# Patient Record
Sex: Female | Born: 1955 | Race: Black or African American | Hispanic: No | Marital: Married | Smoking: Former smoker
Health system: Southern US, Community
[De-identification: ages and names within clinical notes are randomized; demographics above are authoritative.]

## PROBLEM LIST (undated history)

## (undated) HISTORY — PX: ABDOMINAL HYSTERECTOMY: SHX81

## (undated) HISTORY — PX: CHOLECYSTECTOMY: SHX55

---

## 2002-03-01 ENCOUNTER — Encounter: Payer: Self-pay | Admitting: Family Medicine

## 2002-03-01 ENCOUNTER — Encounter: Admission: RE | Admit: 2002-03-01 | Discharge: 2002-03-01 | Payer: Self-pay | Admitting: Family Medicine

## 2005-06-15 ENCOUNTER — Other Ambulatory Visit: Admission: RE | Admit: 2005-06-15 | Discharge: 2005-06-15 | Payer: Self-pay | Admitting: Gynecology

## 2006-07-19 ENCOUNTER — Other Ambulatory Visit: Admission: RE | Admit: 2006-07-19 | Discharge: 2006-07-19 | Payer: Self-pay | Admitting: Gynecology

## 2006-09-21 ENCOUNTER — Inpatient Hospital Stay (HOSPITAL_COMMUNITY): Admission: RE | Admit: 2006-09-21 | Discharge: 2006-09-22 | Payer: Self-pay | Admitting: Gynecology

## 2010-08-09 ENCOUNTER — Encounter: Payer: Self-pay | Admitting: Obstetrics & Gynecology

## 2010-10-30 ENCOUNTER — Other Ambulatory Visit (HOSPITAL_COMMUNITY)
Admission: RE | Admit: 2010-10-30 | Discharge: 2010-10-30 | Disposition: A | Discharge status: Home / Self Care | Payer: BC Managed Care – PPO | Source: Ambulatory Visit | Attending: Gynecology | Admitting: Gynecology

## 2010-10-30 ENCOUNTER — Other Ambulatory Visit: Payer: Self-pay | Admitting: Women's Health

## 2010-10-30 ENCOUNTER — Ambulatory Visit (INDEPENDENT_AMBULATORY_CARE_PROVIDER_SITE_OTHER): Payer: BC Managed Care – PPO | Admitting: Women's Health

## 2010-10-30 DIAGNOSIS — N951 Menopausal and female climacteric states: Secondary | ICD-10-CM

## 2010-10-30 DIAGNOSIS — Z13220 Encounter for screening for lipoid disorders: Secondary | ICD-10-CM

## 2010-10-30 DIAGNOSIS — Z01419 Encounter for gynecological examination (general) (routine) without abnormal findings: Secondary | ICD-10-CM

## 2010-10-30 DIAGNOSIS — Z124 Encounter for screening for malignant neoplasm of cervix: Secondary | ICD-10-CM | POA: Insufficient documentation

## 2010-10-30 DIAGNOSIS — Z833 Family history of diabetes mellitus: Secondary | ICD-10-CM

## 2010-10-31 ENCOUNTER — Other Ambulatory Visit: Payer: Self-pay | Admitting: Gynecology

## 2010-10-31 DIAGNOSIS — Z1231 Encounter for screening mammogram for malignant neoplasm of breast: Secondary | ICD-10-CM

## 2010-11-14 ENCOUNTER — Ambulatory Visit (HOSPITAL_COMMUNITY)
Admission: RE | Admit: 2010-11-14 | Discharge: 2010-11-14 | Disposition: A | Discharge status: Home / Self Care | Payer: BC Managed Care – PPO | Source: Ambulatory Visit | Attending: Gynecology | Admitting: Gynecology

## 2010-11-14 DIAGNOSIS — Z1231 Encounter for screening mammogram for malignant neoplasm of breast: Secondary | ICD-10-CM | POA: Insufficient documentation

## 2011-01-10 ENCOUNTER — Inpatient Hospital Stay (HOSPITAL_COMMUNITY)
Admission: EM | Admit: 2011-01-10 | Discharge: 2011-01-14 | DRG: 419 | Disposition: A | Discharge status: Home / Self Care | Payer: BC Managed Care – PPO | Attending: General Surgery | Admitting: General Surgery

## 2011-01-10 ENCOUNTER — Emergency Department (HOSPITAL_COMMUNITY): Payer: BC Managed Care – PPO

## 2011-01-10 DIAGNOSIS — F17200 Nicotine dependence, unspecified, uncomplicated: Secondary | ICD-10-CM | POA: Diagnosis present

## 2011-01-10 DIAGNOSIS — K219 Gastro-esophageal reflux disease without esophagitis: Secondary | ICD-10-CM | POA: Diagnosis present

## 2011-01-10 DIAGNOSIS — K810 Acute cholecystitis: Principal | ICD-10-CM | POA: Diagnosis present

## 2011-01-10 LAB — CK TOTAL AND CKMB (NOT AT ARMC): Relative Index: 1.5 (ref 0.0–2.5)

## 2011-01-10 LAB — DIFFERENTIAL
Basophils Absolute: 0 10*3/uL (ref 0.0–0.1)
Basophils Relative: 0 % (ref 0–1)
Eosinophils Absolute: 0 10*3/uL (ref 0.0–0.7)
Eosinophils Relative: 0 % (ref 0–5)
Lymphocytes Relative: 17 % (ref 12–46)
Lymphs Abs: 1.4 10*3/uL (ref 0.7–4.0)
Monocytes Absolute: 0.4 10*3/uL (ref 0.1–1.0)
Monocytes Relative: 5 % (ref 3–12)
Neutro Abs: 6.4 10*3/uL (ref 1.7–7.7)
Neutrophils Relative %: 79 % — ABNORMAL HIGH (ref 43–77)

## 2011-01-10 LAB — CBC
HCT: 36.6 % (ref 36.0–46.0)
MCV: 77.2 fL — ABNORMAL LOW (ref 78.0–100.0)
Platelets: 412 10*3/uL — ABNORMAL HIGH (ref 150–400)
RBC: 4.74 MIL/uL (ref 3.87–5.11)
RDW: 15.6 % — ABNORMAL HIGH (ref 11.5–15.5)
WBC: 8.2 10*3/uL (ref 4.0–10.5)

## 2011-01-10 LAB — COMPREHENSIVE METABOLIC PANEL
AST: 14 U/L (ref 0–37)
Albumin: 3.8 g/dL (ref 3.5–5.2)
Alkaline Phosphatase: 79 U/L (ref 39–117)
CO2: 24 mEq/L (ref 19–32)
Chloride: 102 mEq/L (ref 96–112)
Creatinine, Ser: <0.47 mg/dL — ABNORMAL LOW (ref 0.50–1.10)
Potassium: 3.8 mEq/L (ref 3.5–5.1)
Total Bilirubin: 0.4 mg/dL (ref 0.3–1.2)

## 2011-01-10 LAB — HEPATIC FUNCTION PANEL
ALT: 17 U/L (ref 0–35)
AST: 17 U/L (ref 0–37)
Albumin: 3.8 g/dL (ref 3.5–5.2)
Bilirubin, Direct: 0.1 mg/dL (ref 0.0–0.3)
Total Protein: 7.5 g/dL (ref 6.0–8.3)

## 2011-01-10 LAB — URINALYSIS, ROUTINE W REFLEX MICROSCOPIC
Bilirubin Urine: NEGATIVE
Glucose, UA: NEGATIVE mg/dL
Hgb urine dipstick: NEGATIVE
Ketones, ur: NEGATIVE mg/dL
Protein, ur: NEGATIVE mg/dL
pH: 5 (ref 5.0–8.0)

## 2011-01-10 LAB — TROPONIN I: Troponin I: <0.3 ng/mL (ref <0.30)

## 2011-01-11 ENCOUNTER — Inpatient Hospital Stay (HOSPITAL_COMMUNITY): Payer: BC Managed Care – PPO

## 2011-01-11 NOTE — H&P (Signed)
  NAMEJAYDALYN, [name]                 ACCOUNT NO.:  [id number]  MEDICAL RECORD NO.:  [id number]  LOCATION:  5154                         FACILITY:  [facility]  PHYSICIAN:  Wilmon Arms. Corliss Skains, M.D. DATE OF BIRTH:  16-Sep-1955  DATE OF ADMISSION:  01/10/2011 DATE OF DISCHARGE:                             HISTORY & PHYSICAL   CHIEF COMPLAINT:  Right upper quadrant abdominal pain, nausea, and vomiting.  HISTORY OF PRESENT ILLNESS:  This is a 55 year old female who presents with a 24-hour history of right upper quadrant epigastric pain.  This began after eating a meal of spicy chicken last night.  The pain has persisted and actually worsened through the day.  She has had nausea and vomiting with greenish-appearing emesis.  She denies any hematemesis. The pain had been radiating up into her chest and around her right side, but has improved somewhat with IV pain medication.  She has had some diarrhea.  She also reports that her abdomen is fairly bloated.  She reports a lot of problems with "indigestion" usually relieved by Tums. She has also noted a lot of bloating and gas over the last few months. She was seen in urgent care facility who referred her to the emergency department for evaluation.  PAST MEDICAL HISTORY:  None.  PAST SURGICAL HISTORY:  Two C-sections and a hysterectomy.  FAMILY HISTORY:  Her father has diabetes and hypertension.  SOCIAL HISTORY:  Nonsmoker, nondrinker.  REVIEW OF SYSTEMS:  Otherwise negative.  MEDICATIONS:  None.  ALLERGIES:  None.  PHYSICAL EXAMINATION:  VITAL SIGNS:  Temperature 97.8, heart rate 74, blood pressure 108/68, respirations 18, sats 99%. GENERAL:  This is a well-developed, well-nourished female who is rather uncomfortable appearing. HEENT:  EOMI.  Sclerae anicteric. NECK:  No masses.  No thyromegaly. LUNGS:  Clear.  Normal respiratory effort. HEART:  Regular rhythm.  No murmur. ABDOMEN:  Positive bowel sounds, mildly distended, tender in the  right upper quadrant and epigastrium.  No palpable masses.  No rebound or guarding. EXTREMITIES:  No edema. SKIN:  Warm, dry with no sign of jaundice.  LABORATORY DATA:  White count 8.2, hemoglobin 12.9, platelet count 412. Electrolytes within normal limits.  Liver function test within normal limits.  Lipase 23.  Urinalysis negative.  Ultrasound shows a 2-cm gallstone lodged in the neck of her gallbladder, but no pericholecystic fluid or wall thickening.  She does have a positive sonographic Murphy's sign.  The common bile duct is measuring 9 mm, which is mildly dilated. Acute abdominal series is negative.  IMPRESSION:  Symptomatic cholelithiasis with a possible early acute cholecystitis.  PLAN:  We will admit the patient for IV antibiotics, pain control, nausea control, and IV hydration.  We will plan for probable laparoscopic cholecystectomy in the morning.     Wilmon Arms. Corliss Skains, M.D.     MKT/MEDQ  D:  01/10/2011  T:  01/11/2011  Job:  [id number]  Electronically Signed by Manus Rudd M.D. on 01/11/2011 08:33:53 PM

## 2011-01-12 ENCOUNTER — Other Ambulatory Visit (INDEPENDENT_AMBULATORY_CARE_PROVIDER_SITE_OTHER): Payer: Self-pay | Admitting: General Surgery

## 2011-01-12 ENCOUNTER — Inpatient Hospital Stay (HOSPITAL_COMMUNITY): Payer: BC Managed Care – PPO

## 2011-01-12 LAB — SURGICAL PCR SCREEN
MRSA, PCR: NEGATIVE
Staphylococcus aureus: NEGATIVE

## 2011-01-14 LAB — COMPREHENSIVE METABOLIC PANEL
Albumin: 2.9 g/dL — ABNORMAL LOW (ref 3.5–5.2)
Alkaline Phosphatase: 111 U/L (ref 39–117)
BUN: 5 mg/dL — ABNORMAL LOW (ref 6–23)
CO2: 30 mEq/L (ref 19–32)
Chloride: 105 mEq/L (ref 96–112)
Potassium: 3.2 mEq/L — ABNORMAL LOW (ref 3.5–5.1)
Total Bilirubin: 1 mg/dL (ref 0.3–1.2)

## 2011-01-14 LAB — CBC
HCT: 30 % — ABNORMAL LOW (ref 36.0–46.0)
Hemoglobin: 10.4 g/dL — ABNORMAL LOW (ref 12.0–15.0)
MCV: 78.3 fL (ref 78.0–100.0)
RBC: 3.83 MIL/uL — ABNORMAL LOW (ref 3.87–5.11)
RDW: 15.1 % (ref 11.5–15.5)
WBC: 9.3 10*3/uL (ref 4.0–10.5)

## 2011-01-29 NOTE — Op Note (Signed)
[name], [name] NO.:  [id number]  MEDICAL RECORD NO.:  [id number]  LOCATION:  5154                         FACILITY:  [facility]  PHYSICIAN:  Mary Sella. Andrey Campanile, MD     DATE OF BIRTH:  1955/08/26  DATE OF PROCEDURE:  01/12/2011 DATE OF DISCHARGE:                              OPERATIVE REPORT   PREOPERATIVE DIAGNOSIS:  Acute cholecystitis.  POSTOPERATIVE DIAGNOSIS:  Acute cholecystitis.  PROCEDURE:  Laparoscopic cholecystectomy with intraoperative cholangiogram.  SURGEON:  Mary Sella. Andrey Campanile, MD  ASSISTANT:  Brayton El, PA-C  ANESTHESIA:  General plus local consisting 0.25% with Marcaine.  SPECIMEN:  Gallbladder.  FINDINGS:  The patient had a very distended thick-walled edematous gallbladder.  We had to aspirate in order to help with retraction.  We did achieve the critical view.  The cholangiogram demonstrated prompt filling of the cystic common bile duct, common hepatic left and right hepatic ducts as well as prompt emptying into duodenum.  The bile duct was grossly dilated, but there was no evidence of filling defect.  INDICATIONS FOR PROCEDURE:  The patient is an obese 55 year old African American female who started having abdominal pain on Friday evening.  It occurred after eating some spicy foods.  The pain persisted.  She denied having any previous symptoms.  It was in her epigastrium and right radiated to her right side.  She came to the emergency room.  She was found to have a 2-cm gallstone in the neck of her gallbladder.  She was admitted for IV antibiotics as well as IV fluids and pain control.  She was taken today to the operating room for laparoscopic possible open cholecystectomy.  We discussed at length the risks and benefits of surgery including, but not limited to bleeding, infection, injury to surrounding structures, bile leak, prolonged diarrhea, DVT occurrence, need to convert to an open procedure, injury to the common bile  duct requiring major reconstructive bile duct surgery as well as abscess formation.  We also talked about the typical postoperative course.  She elected to proceed to surgery.  DESCRIPTION OF PROCEDURE:  After obtaining informed consent, the patient was taken to the operating room and placed supine on the operating table.  General endotracheal anesthesia was established.  Sequential compression devices were placed.  She received antibiotics earlier this morning.  A surgical time-out was performed.  The abdomen was prepped and draped in the usual standard surgical fashion.  Local was infiltrated at the base of her umbilicus.  Next, a 1-inch vertical infraumbilical incision was made with a #11 blade.  The fascia was grasped with Kochers and lifted anteriorly.  Next, the fascia was incised and the abdominal cavity was entered.  A pursestring suture consisting of 0 Vicryl on a UR6 needle was placed around the fascial edges.  The Hasson trocar was placed into the abdominal cavity under direct visualization and pneumoperitoneum was smoothly established up to a patient pressure of 15 mmHg.  The laparoscope was advanced.  She was then placed in reverse Trendelenburg and rotated to the left.  I placed a 5-mm trocar in the subxiphoid position and two 5-mm trocars in the right hypochondrium all under direct  visualization after local had been identified.  She had a lot of omentum plastered to the body of her gallbladder.  This was peeled off.  The gallbladder was very distended and thick-walled actually.  We had to aspirate it and decompress in order to help with mobilization.  Once this was done, the gallbladder was retracted to the right shoulder and the neck was retracted laterally.  I identified the common bile duct.  It was tinted up.  I then incised the peritoneum both medially and laterally with hook electrocautery to aid with mobilization.  Then using the suction catheter, I performed a  blunt dissection along with  water dissection.  I was able to tease up the gallbladder away from the common bile duct.  I began to visualize the cystic duct as well as the cystic artery.  I was able to lift the gallbladder up and away from the common bile duct.  Again, I was able to circumferentially dissect around the cystic duct and the cystic artery.  Once I was convinced these are the only two structures entering the gallbladder, I placed a clip across the distal cystic duct as I entered the gallbladder, then partially transected it with Endo shears, percutaneously placed a Cook cholangiogram catheter through the abdominal wall and secured it with a clip.  The cholangiogram was performed as described above. Pneumoperitoneum was reestablished.  The clip securing the cholangiogram catheter was removed and the cholangiogram catheter was removed.  Three clips were placed on the downside of the cystic duct.  I then transected it with Endo shears.  One clip was placed on the downside cystic artery and one distally.  It was transected with hook electrocautery.  We then mobilized the gallbladder up at the gallbladder fossa using hook electrocautery.  The gallbladder essentially freed.  The camera was placed in the subxiphoid trocar and the bag was placed through the umbilical trocar and the specimen was placed within the bag and extracted from the abdominal cavity.  Pneumoperitoneum was reestablished.  I then irrigated the right upper quadrant with essentially 2 L of saline and aspirated it.  I inspected the gallbladder fossa.  There was no evidence of bleeding or bile leak.  The clips were secured across the cystic duct and cystic artery remnant.  I removed the Hasson trocar and tied down the previously placed pursestring suture, thus obliterating fascial defect.  This was confirmed laparoscopically. There was no air leak at the umbilicus.  Pneumoperitoneum was released and the remaining  trocars removed.  All skin incisions were closed with a 4-0 Monocryl in subcuticular fashion followed by the application of Dermabond.  The patient was extubated and taken to recovery room in stable addition.  There were no immediate complications.  The patient tolerated the procedure well.  All needle, instrument, and sponge counts were correct x2.     Mary Sella. Andrey Campanile, MD     EMW/MEDQ  D:  01/12/2011  T:  01/13/2011  Job:  [id number]  Electronically Signed by Gaynelle Adu M.D. on 01/29/2011 02:25:14 PM

## 2011-02-03 ENCOUNTER — Encounter (INDEPENDENT_AMBULATORY_CARE_PROVIDER_SITE_OTHER): Payer: Self-pay

## 2011-02-04 NOTE — Discharge Summary (Signed)
  NAMEKODY, VIGIL NO.:  [id number]  MEDICAL RECORD NO.:  [id number]  LOCATION:  5154                         FACILITY:  [facility]  PHYSICIAN:  Mary Sella. Andrey Campanile, MD     DATE OF BIRTH:  1956/06/01  DATE OF ADMISSION:  01/10/2011 DATE OF DISCHARGE:  01/14/2011                              DISCHARGE SUMMARY   ADMISSION DIAGNOSIS:  Acute cholecystitis.  DISCHARGE DIAGNOSIS:  Acute cholecystitis.  PROCEDURES:  Laparoscopic cholecystectomy with intraoperative cholangiogram January 12, 2011.  BRIEF HISTORY:  The patient is a 55 year old female who presented with 24 hours of right upper quadrant pain after eating spicy chicken the night prior.  The pain persisted and became worse.  She was seen in the emergency room and workup included a white count of 8.2, hemoglobin 12.9, platelets 412,000.  Electrolytes were normal.  LFTs were normal. Ultrasound shows a 2-cm gallstone lodged in the neck of the gallbladder, there is no pericholecystic fluid or wall thickening.  She does have a positive sonographic Murphy sign.  The common bile duct was dilated measuring 9 mm which is mildly dilated.  Abdominal series was negative. HOSPITAL COURSE:  The patient was seen in consultation by Dr. Manus Rudd and subsequently admitted for further evaluation and treatment.  He started her on IV antibiotics and hydration and pain control and nausea control with plans for laparoscopic cholecystectomy the following morning.  The patient was seen and taken to the operating room the next morning, January 12, 2011, and underwent laparoscopic cholecystectomy with intraoperative cholangiogram.  She tolerated the procedure well.  First postoperative morning, she was doing well.  Her wounds looked good.  She had some mild nausea but no vomiting.  Her diet was ultimately advanced and she was discharged later on January 14, 2011, her second postoperative day.  She was sent home on her preadmission medicines  and Percocet one to two p.o. q.4 h. p.r.n.Marland Kitchen  She returned for followup February 03, 2011, at 2:30 p.m.  DISCHARGE ACTIVITY:  Light to moderate for the next week or two.  No strenuous activity.  She is to wash daily.  She can remove Steri-Strips in 1 week.  Call for any redness, fever, abdominal pain or new discomfort.  CONDITION ON DISCHARGE:  Improved.     Eber Hong, P.A.   ______________________________ Mary Sella. Andrey Campanile, MD   WDJ/MEDQ  D:  01/26/2011  T:  01/27/2011  Job:  [id number]  Electronically Signed by Sherrie George P.A. on 02/03/2011 04:17:45 PM Electronically Signed by Gaynelle Adu M.D. on 02/04/2011 11:26:08 PM

## 2011-02-06 ENCOUNTER — Telehealth (INDEPENDENT_AMBULATORY_CARE_PROVIDER_SITE_OTHER): Payer: Self-pay | Admitting: General Surgery

## 2011-02-10 NOTE — Telephone Encounter (Signed)
Patient has scheduled follow up appt

## 2011-11-23 ENCOUNTER — Encounter: Payer: Self-pay | Admitting: Women's Health

## 2017-09-13 ENCOUNTER — Other Ambulatory Visit (HOSPITAL_COMMUNITY): Payer: Self-pay | Admitting: *Deleted

## 2017-09-13 DIAGNOSIS — N644 Mastodynia: Secondary | ICD-10-CM

## 2017-10-14 ENCOUNTER — Ambulatory Visit
Admission: RE | Admit: 2017-10-14 | Discharge: 2017-10-14 | Disposition: A | Discharge status: Home / Self Care | Payer: No Typology Code available for payment source | Source: Ambulatory Visit | Attending: Obstetrics and Gynecology | Admitting: Obstetrics and Gynecology

## 2017-10-14 ENCOUNTER — Other Ambulatory Visit (HOSPITAL_COMMUNITY): Payer: Self-pay | Admitting: Obstetrics and Gynecology

## 2017-10-14 ENCOUNTER — Encounter (HOSPITAL_COMMUNITY): Payer: Self-pay

## 2017-10-14 ENCOUNTER — Ambulatory Visit (HOSPITAL_COMMUNITY)
Admission: RE | Admit: 2017-10-14 | Discharge: 2017-10-14 | Disposition: A | Discharge status: Home / Self Care | Payer: Self-pay | Source: Ambulatory Visit | Attending: Obstetrics and Gynecology | Admitting: Obstetrics and Gynecology

## 2017-10-14 ENCOUNTER — Other Ambulatory Visit: Payer: Self-pay | Admitting: Obstetrics and Gynecology

## 2017-10-14 VITALS — BP 138/88 | Ht 64.0 in

## 2017-10-14 DIAGNOSIS — Z01419 Encounter for gynecological examination (general) (routine) without abnormal findings: Secondary | ICD-10-CM

## 2017-10-14 DIAGNOSIS — N6310 Unspecified lump in the right breast, unspecified quadrant: Secondary | ICD-10-CM

## 2017-10-14 DIAGNOSIS — N898 Other specified noninflammatory disorders of vagina: Secondary | ICD-10-CM

## 2017-10-14 DIAGNOSIS — N644 Mastodynia: Secondary | ICD-10-CM

## 2017-10-14 NOTE — Patient Instructions (Signed)
Explained breast self awareness with Francoise Ceoobin Colleen Royse. Let patient know [facility] will cover Pap smears and HPV typing every 5 years unless has a history of abnormal Pap smears. [facility] and possible left breast ultrasound. Appointment scheduled for Thursday, October 14, 2017 at 1010. Let patient know will follow up with her within the next week with results of Pap smear and wet prep by phone. Francoise Ceoobin Colleen Schurman verbalized understanding.  Faryn Sieg, Kathaleen Maserhristine Poll, RN 12:18 PM

## 2017-10-14 NOTE — Progress Notes (Signed)
Complaints of bilateral outer breast tenderness that is greater within the left breast that comes and goes. Patient rates the pain at a 7.5 out of 10.  Pap Smear: Pap smear completed today. [facility]. Per patient has no history of an abnormal Pap smear. Patient has a history of a supracervical hysterectomy in 2005 due to AUB and fibroids. Last Pap smear result is in Epic.  Physical exam: Breasts Left breast is slightly larger than right breast. Per patient that is normal for her. No skin abnormalities bilateral breasts. No nipple retraction bilateral breasts. No nipple discharge bilateral breasts. No lymphadenopathy. No lumps palpated bilateral breasts. Complaints of left breast tenderness between 3-4 o'clock on exam. [facility] and possible left breast ultrasound. Appointment scheduled for Thursday, October 14, 2017 at 1010.        Pelvic/Bimanual   Ext Genitalia Outer genital is raw appearing. Patient states her outer genitalia has been itchy lately. No swelling and no discharge observed on external genitalia.         Vagina Vagina pink and normal texture. No lesions and thick white discharge observed in vagina. Wet prep completed.         Cervix Cervix is present. Cervix pink and of normal texture. Thick white discharge observed on cervix.    Uterus Uterus is absent due to patients history of a hysterectomy for benign reasons.    Adnexae Bilateral ovaries are absent due to patients history of a hysterectomy for benign reasons.        Rectovaginal No rectal exam completed today since patient had no rectal complaints. No skin abnormalities observed on exam.    Smoking History: Patient is a former smoker that quit 7 years ago.  Patient Navigation: Patient education provided. Access to services provided for patient through [facility] program.   Colorectal Cancer  Screening: Per patient has never had a colonoscopy completed. No complaints today. FIT Test given to patient to complete and return to [facility].  Breast and Cervical Cancer Risk Assessment: Patient has no family history of breast cancer, known genetic mutations, or radiation treatment to the chest before age 60. Patient has no history of cervical dysplasia, immunocompromised, or DES exposure in-utero.

## 2017-10-15 LAB — CYTOLOGY - PAP
Adequacy: ABSENT
BACTERIAL VAGINITIS: NEGATIVE
CANDIDA VAGINITIS: NEGATIVE
Diagnosis: NEGATIVE
HPV (WINDOPATH): NOT DETECTED
Trichomonas: NEGATIVE

## 2017-10-19 ENCOUNTER — Encounter (HOSPITAL_COMMUNITY): Payer: Self-pay | Admitting: *Deleted

## 2017-11-10 ENCOUNTER — Encounter (HOSPITAL_COMMUNITY): Payer: Self-pay | Admitting: *Deleted

## 2017-11-10 NOTE — Progress Notes (Signed)
Letter mailed to patient with negative pap smear results. HPV was negative. Next pap smear due in five years. 

## 2018-04-19 ENCOUNTER — Ambulatory Visit
Admission: RE | Admit: 2018-04-19 | Discharge: 2018-04-19 | Disposition: A | Discharge status: Home / Self Care | Payer: Self-pay | Source: Ambulatory Visit | Attending: Obstetrics and Gynecology | Admitting: Obstetrics and Gynecology

## 2018-04-19 ENCOUNTER — Other Ambulatory Visit: Payer: Self-pay | Admitting: Obstetrics and Gynecology

## 2018-04-19 ENCOUNTER — Ambulatory Visit
Admission: RE | Admit: 2018-04-19 | Discharge: 2018-04-19 | Disposition: A | Discharge status: Home / Self Care | Payer: No Typology Code available for payment source | Source: Ambulatory Visit | Attending: Obstetrics and Gynecology | Admitting: Obstetrics and Gynecology

## 2018-04-19 DIAGNOSIS — N6310 Unspecified lump in the right breast, unspecified quadrant: Secondary | ICD-10-CM

## 2018-10-21 ENCOUNTER — Other Ambulatory Visit: Payer: No Typology Code available for payment source

## 2019-01-28 ENCOUNTER — Other Ambulatory Visit: Payer: Self-pay | Admitting: *Deleted

## 2019-01-28 DIAGNOSIS — Z20822 Contact with and (suspected) exposure to covid-19: Secondary | ICD-10-CM

## 2019-02-03 LAB — NOVEL CORONAVIRUS, NAA: SARS-CoV-2, NAA: NOT DETECTED

## 2019-05-27 ENCOUNTER — Other Ambulatory Visit: Payer: Self-pay

## 2019-05-27 DIAGNOSIS — Z20822 Contact with and (suspected) exposure to covid-19: Secondary | ICD-10-CM

## 2019-05-29 LAB — NOVEL CORONAVIRUS, NAA: SARS-CoV-2, NAA: NOT DETECTED

## 2019-06-13 ENCOUNTER — Other Ambulatory Visit: Payer: Self-pay | Admitting: Cardiology

## 2019-06-13 DIAGNOSIS — Z20822 Contact with and (suspected) exposure to covid-19: Secondary | ICD-10-CM

## 2019-06-15 LAB — NOVEL CORONAVIRUS, NAA: SARS-CoV-2, NAA: NOT DETECTED

## 2019-08-16 ENCOUNTER — Other Ambulatory Visit: Payer: Self-pay

## 2019-08-16 ENCOUNTER — Ambulatory Visit (HOSPITAL_COMMUNITY)
Admission: EM | Admit: 2019-08-16 | Discharge: 2019-08-16 | Disposition: A | Discharge status: Home / Self Care | Payer: No Typology Code available for payment source

## 2019-08-16 NOTE — ED Notes (Signed)
Pt states she swallowed a screw last night while eating chicken.  Her husband did the Heimlich on her and he was able to extract the screw.  Pt complains of some pain in her throat when she swallows.  Pt concerned for damage.  Dr. Tracie Harrier stated she will need a scope done that we cannot do here. Pt did not see the provider.  Pt decided to leave so she did not have to pay for two visits and she is going to go to the ED instead.  Pt is able to swallow fine and she is in NAD.

## 2019-10-09 ENCOUNTER — Other Ambulatory Visit: Payer: Self-pay | Admitting: Obstetrics and Gynecology

## 2019-10-09 ENCOUNTER — Other Ambulatory Visit: Payer: Self-pay

## 2019-10-09 DIAGNOSIS — N63 Unspecified lump in unspecified breast: Secondary | ICD-10-CM

## 2019-10-09 DIAGNOSIS — N644 Mastodynia: Secondary | ICD-10-CM

## 2019-10-24 ENCOUNTER — Ambulatory Visit: Payer: Self-pay | Admitting: Student

## 2019-10-24 ENCOUNTER — Other Ambulatory Visit: Payer: Self-pay

## 2019-10-24 VITALS — BP 126/80 | Temp 98.2°F | Wt 250.0 lb

## 2019-10-24 DIAGNOSIS — Z1239 Encounter for other screening for malignant neoplasm of breast: Secondary | ICD-10-CM

## 2019-10-24 NOTE — Progress Notes (Signed)
[name] is a 64 y.o. female who presents to [facility] clinic today with complaint of left breast pain for one month.     Pap Smear: Pap not smear completed today. Last Pap smear was [facility] clinic in 09/2017 and was negative.  Per patient has no history of an abnormal Pap smear. Last Pap smear result is available in Epic.   Physical exam: Breasts Breasts symmetrical. No skin abnormalities bilateral breasts. No nipple retraction bilateral breasts. No nipple discharge bilateral breasts. No lymphadenopathy. No lumps palpated bilateral breasts.    No obvious masses or lumps palpated.    Pelvic/Bimanual Pap is not indicated today    Smoking History: Patient has never smoked   Patient Navigation: Patient education provided. Access to services provided for patient through [facility] program.  Colorectal Cancer Screening: Per patient has never had colonoscopy completed No complaints today.    Breast and Cervical Cancer Risk Assessment: Patient does not have family history of breast cancer, known genetic mutations, or radiation treatment to the chest before age 71. Patient does not have history of cervical dysplasia, immunocompromised, or DES exposure in-utero.  Risk Assessment    No risk assessment data      A: [facility] exam without pap smear Complaint of left breast pain  P: Referred patient to the [facility] for a diagnostic mammogram. Appointment scheduled for October 26, 2019.   Marylene Land, CNM 10/24/2019 2:24 PM

## 2019-10-26 ENCOUNTER — Ambulatory Visit
Admission: RE | Admit: 2019-10-26 | Discharge: 2019-10-26 | Disposition: A | Discharge status: Home / Self Care | Payer: Self-pay | Source: Ambulatory Visit | Attending: Obstetrics and Gynecology | Admitting: Obstetrics and Gynecology

## 2019-10-26 ENCOUNTER — Ambulatory Visit
Admission: RE | Admit: 2019-10-26 | Discharge: 2019-10-26 | Disposition: A | Discharge status: Home / Self Care | Payer: No Typology Code available for payment source | Source: Ambulatory Visit | Attending: Obstetrics and Gynecology | Admitting: Obstetrics and Gynecology

## 2019-10-26 ENCOUNTER — Other Ambulatory Visit: Payer: Self-pay

## 2019-10-26 DIAGNOSIS — N644 Mastodynia: Secondary | ICD-10-CM

## 2019-10-26 DIAGNOSIS — N63 Unspecified lump in unspecified breast: Secondary | ICD-10-CM

## 2020-04-05 ENCOUNTER — Other Ambulatory Visit: Payer: No Typology Code available for payment source

## 2020-04-05 ENCOUNTER — Other Ambulatory Visit: Payer: Self-pay | Admitting: Sleep Medicine

## 2020-04-05 DIAGNOSIS — Z20822 Contact with and (suspected) exposure to covid-19: Secondary | ICD-10-CM

## 2020-04-08 LAB — NOVEL CORONAVIRUS, NAA: SARS-CoV-2, NAA: NOT DETECTED

## 2020-04-26 ENCOUNTER — Other Ambulatory Visit: Payer: No Typology Code available for payment source

## 2020-04-26 DIAGNOSIS — Z20822 Contact with and (suspected) exposure to covid-19: Secondary | ICD-10-CM

## 2020-04-27 LAB — SARS-COV-2, NAA 2 DAY TAT

## 2020-04-27 LAB — NOVEL CORONAVIRUS, NAA: SARS-CoV-2, NAA: NOT DETECTED

## 2020-07-31 ENCOUNTER — Other Ambulatory Visit: Payer: Self-pay

## 2020-07-31 ENCOUNTER — Other Ambulatory Visit: Payer: No Typology Code available for payment source

## 2020-07-31 DIAGNOSIS — Z20822 Contact with and (suspected) exposure to covid-19: Secondary | ICD-10-CM

## 2020-08-03 LAB — NOVEL CORONAVIRUS, NAA: SARS-CoV-2, NAA: NOT DETECTED

## 2020-10-24 ENCOUNTER — Other Ambulatory Visit: Payer: Self-pay

## 2020-10-24 ENCOUNTER — Ambulatory Visit: Payer: No Typology Code available for payment source | Attending: Internal Medicine

## 2020-10-24 DIAGNOSIS — Z23 Encounter for immunization: Secondary | ICD-10-CM

## 2020-10-24 NOTE — Progress Notes (Signed)
   Covid-19 Vaccination Clinic  Name:  [name]    MRN: [id number] DOB: 10-16-1955  10/24/2020  [name] was observed post Covid-19 immunization for 15 minutes without incident. She was provided with Vaccine Information Sheet and instruction to access the V-Safe system.   [name] was instructed to call 911 with any severe reactions post vaccine: Marland Kitchen Difficulty breathing  . Swelling of face and throat  . A fast heartbeat  . A bad rash all over body  . Dizziness and weakness   Immunizations Administered    Name Date Dose VIS Date Route   Moderna Covid-19 Booster Vaccine 10/24/2020 12:40 PM 0.25 mL 05/08/2020 Intramuscular   Manufacturer: Gala Murdoch   Lot: [id number]   NDC: 10211-173-56

## 2020-10-31 ENCOUNTER — Other Ambulatory Visit (HOSPITAL_BASED_OUTPATIENT_CLINIC_OR_DEPARTMENT_OTHER): Payer: Self-pay

## 2020-10-31 MED ORDER — COVID-19 MRNA VACC (MODERNA) 100 MCG/0.5ML IM SUSP
INTRAMUSCULAR | 0 refills | Status: DC
  Filled 2020-10-31: qty 0.25, 1d supply, fill #0

## 2020-11-27 ENCOUNTER — Other Ambulatory Visit: Payer: Self-pay | Admitting: Obstetrics and Gynecology

## 2020-11-27 DIAGNOSIS — Z1231 Encounter for screening mammogram for malignant neoplasm of breast: Secondary | ICD-10-CM

## 2020-11-29 ENCOUNTER — Ambulatory Visit: Payer: No Typology Code available for payment source | Attending: Internal Medicine

## 2020-11-29 DIAGNOSIS — Z20822 Contact with and (suspected) exposure to covid-19: Secondary | ICD-10-CM | POA: Insufficient documentation

## 2020-11-30 LAB — NOVEL CORONAVIRUS, NAA: SARS-CoV-2, NAA: NOT DETECTED

## 2020-11-30 LAB — SARS-COV-2, NAA 2 DAY TAT

## 2020-12-19 ENCOUNTER — Ambulatory Visit
Admission: RE | Admit: 2020-12-19 | Discharge: 2020-12-19 | Disposition: A | Discharge status: Home / Self Care | Payer: No Typology Code available for payment source | Source: Ambulatory Visit | Attending: Obstetrics and Gynecology | Admitting: Obstetrics and Gynecology

## 2020-12-19 ENCOUNTER — Ambulatory Visit: Payer: Self-pay | Admitting: *Deleted

## 2020-12-19 ENCOUNTER — Other Ambulatory Visit: Payer: Self-pay

## 2020-12-19 VITALS — BP 130/80 | Wt 171.3 lb

## 2020-12-19 DIAGNOSIS — Z1239 Encounter for other screening for malignant neoplasm of breast: Secondary | ICD-10-CM

## 2020-12-19 DIAGNOSIS — Z1231 Encounter for screening mammogram for malignant neoplasm of breast: Secondary | ICD-10-CM

## 2020-12-19 NOTE — Progress Notes (Signed)
[name] is a 65 y.o. female who presents to [facility] clinic today with complaint of left outer breast tenderness that comes and goes since prior to her previous B[facility] appointment 10/14/2017. Patient had a diagnostic mammogram for follow-up.   Pap Smear: Pap smear not completed today. Last Pap smear was 10/14/2017 at [facility] clinic and was normal with negative HPV. Per patient has no history of an abnormal Pap smear. Last Pap smear result is available in Epic.   Physical exam: Breasts Left breast is slightly larger than right breast. Per patient that is normal for her per previous exam 10/14/2017. Patient stated she has noticed it more over the past year since she has lost weight. No skin abnormalities bilateral breasts. No nipple retraction bilateral breasts. No nipple discharge bilateral breasts. No lymphadenopathy. No lumps palpated bilateral breasts. Complaints of left areolar tenderness on exam.     MM DIAG BREAST TOMO UNI RIGHT  Result Date: 04/19/2018 CLINICAL DATA:  Six-month follow-up of probably benign mass in the right breast. EXAM: DIGITAL DIAGNOSTIC RIGHT MAMMOGRAM WITH CAD AND TOMO ULTRASOUND RIGHT BREAST COMPARISON:  October 14, 2017 an outside prior mammogram from October 11, 2014 ACR Breast Density Category b: There are scattered areas of fibroglandular density. FINDINGS: No suspicious mass, architectural distortion, or suspicious microcalcification in the right breast. Mammographic images were processed with CAD. Targeted ultrasound is performed, showing a circumscribed hypoechoic mass parallel to the chest wall at 10 o'clock position 3 cm from nipple measuring 0.6 x 0.2 x 0.4 cm. This mass is sonographically stable and has benign features. IMPRESSION: Stable probably benign mass in the right breast. RECOMMENDATION: Bilateral diagnostic mammogram and possible right breast ultrasound is recommended in March 2020. I have discussed the findings and recommendations with the patient.  Results were also provided in writing at the conclusion of the visit. If applicable, a reminder letter will be sent to the patient regarding the next appointment. BI-RADS CATEGORY  3: Probably benign. Electronically Signed   By: Britta Mccreedy M.D.   On: 04/19/2018 16:20   MM DIAG BREAST TOMO BILATERAL  Addendum Date: 10/15/2017   ADDENDUM REPORT: 10/15/2017 12:02 ADDENDUM: The current study was compared to multiple previous studies from 2013, 2014, 2016. A six-month follow-up mammogram and ultrasound is still recommended of the probably benign mass. Electronically Signed   By: Gerome Sam III M.D   On: 10/15/2017 12:02   Result Date: 10/15/2017 CLINICAL DATA:  Diffuse pain in the lateral left breast. EXAM: DIGITAL DIAGNOSTIC BILATERAL MAMMOGRAM WITH CAD AND TOMO ULTRASOUND RIGHT BREAST COMPARISON:  Previous exam(s). ACR Breast Density Category b: There are scattered areas of fibroglandular density. FINDINGS: There are benign calcifications in the lateral central right breast which layer on 90 degree imaging. No suspicious findings seen in the left breast. There is a possible mass in the lateral central right breast best seen on the CC view. No other suspicious mammographic findings. Mammographic images were processed with CAD. On physical exam, no suspicious lumps are identified. Targeted ultrasound is performed, showing a probably benign mass in the right breast at 10 o'clock, 3 cm from the nipple measuring 7 x 2 by 4 mm. This may correlate with the mammographic finding. IMPRESSION: Probably benign right breast mass.  No other suspicious findings. RECOMMENDATION: Six-month follow-up mammogram and ultrasound of the probably benign right breast mass. I have discussed the findings and recommendations with the patient. Results were also provided in writing at the conclusion of the visit. If applicable,  a reminder letter will be sent to the patient regarding the next appointment. BI-RADS CATEGORY  3: Probably  benign. Electronically Signed: By: Gerome Sam III M.D On: 10/14/2017 12:14   MS DIGITAL DIAG TOMO BILAT  Result Date: 10/26/2019 CLINICAL DATA:  Follow-up probably benign mass in the 10 o'clock position of the right breast. Increasing constant focal pain in the upper outer left breast for the past year. EXAM: DIGITAL DIAGNOSTIC BILATERAL MAMMOGRAM WITH CAD AND TOMO ULTRASOUND BILATERAL BREAST COMPARISON:  Previous exam(s). ACR Breast Density Category b: There are scattered areas of fibroglandular density. FINDINGS: Mammographically unremarkable right breast. No abnormality demonstrated in the left breast at the location focal pain in the upper outer left breast, marked with a metallic marker. In the oblique projection on the left, there is an elongated, oval, circumscribed mass-like density. This is not seen in the craniocaudal projection. Mammographic images were processed with CAD. On physical exam, no mass is palpable in the upper outer left breast at the location of focal pain and tenderness. There is no palpable mass in the retroareolar left breast. Targeted ultrasound is performed, showing normal appearing breast tissue in the upper outer left breast at the location of focal pain and tenderness, including fatty tissue and glandular tissue. Normal appearing breast tissue is also demonstrated in the retroareolar left breast, including normal appearing fibroglandular tissue. On the right, a 6 x 4 x 2 mm oval, horizontally oriented, circumscribed, hypoechoic mass is demonstrated in the 10 o'clock position, 3 cm from the nipple. This measured 7 x 4 x 2 mm on the initial ultrasound dated 10/14/2017. IMPRESSION: 1. No evidence of malignancy. 2. Normal appearing breast tissue in the upper outer left breast in the area focal pain and tenderness. 3. Stable small benign appearing mass in the 10 o'clock position of the right breast. The lack of change over a 2 year period of time is compatible with a benign  process and this does not need further follow-up. RECOMMENDATION: Bilateral screening mammogram in 1 year. I have discussed the findings and recommendations with the patient. If applicable, a reminder letter will be sent to the patient regarding the next appointment. BI-RADS CATEGORY  2: Benign. Electronically Signed   By: Beckie Salts M.D.   On: 10/26/2019 11:18    Pelvic/Bimanual Pap is not indicated today per B[facility]CP guidelines.   Smoking History: Patient is a former smoker that quit in 2009 per patient.   Patient Navigation: Patient education provided. Access to services provided for patient through [facility]CP program.   Colorectal Cancer Screening: Per patient has never had colonoscopy completed. Patient stated she completed a FIT Test in 2049 but did not receive the results. No complaints today.    Breast and Cervical Cancer Risk Assessment: Patient does not have family history of breast cancer, known genetic mutations, or radiation treatment to the chest before age 66. Patient does not have history of cervical dysplasia, immunocompromised, or DES exposure in-utero.  Risk Assessment    Risk Scores      12/19/2020   Last edited by: Narda Rutherford, LPN   5-year risk: 1.5 %   Lifetime risk: 6.5 %         A: [facility]CP exam without pap smear Complaint of left outer breast pain.  P: Referred patient to the [facility] for a screening mammogram on the mobile unit. Appointment scheduled Thursday, December 19, 2020 at 1500.  Priscille Heidelberg, RN 12/19/2020 2:16 PM

## 2020-12-19 NOTE — Patient Instructions (Signed)
Explained breast self awareness with Francoise Ceo. Patient did not need a Pap smear today due to last Pap smear and HPV typing was 10/14/2017. Let her know [facility] will cover Pap smears and HPV typing every 5 years unless has a history of abnormal Pap smears. Referred patient to the [facility] for a screening mammogram on the mobile unit. Appointment scheduled Thursday, December 19, 2020 at 1500. Patient escorted to the mobile unit following B[facility]CP appointment for her screening mammogram. Let patient know the Breast Center will follow up with her within the next couple weeks with results of her mammogram by letter or phone. Francoise Ceo verbalized understanding.  Nayib Remer, Kathaleen Maser, RN 2:16 PM

## 2021-10-03 ENCOUNTER — Other Ambulatory Visit (HOSPITAL_COMMUNITY): Payer: Self-pay

## 2021-10-21 ENCOUNTER — Other Ambulatory Visit: Payer: Self-pay

## 2021-10-21 DIAGNOSIS — N644 Mastodynia: Secondary | ICD-10-CM

## 2021-12-21 IMAGING — MG MM DIGITAL SCREENING BILAT W/ TOMO AND CAD
6 of 10 series · 6 of 30 positions shown · non-contrast
Comparison: Previous exam(s).

CLINICAL DATA: Screening.

EXAM:
DIGITAL SCREENING BILATERAL MAMMOGRAM WITH TOMOSYNTHESIS AND CAD
TECHNIQUE: Bilateral screening digital craniocaudal and mediolateral oblique
mammograms were obtained. Bilateral screening digital breast
tomosynthesis was performed. The images were evaluated with
computer-aided detection.

[L CC synth-2D]
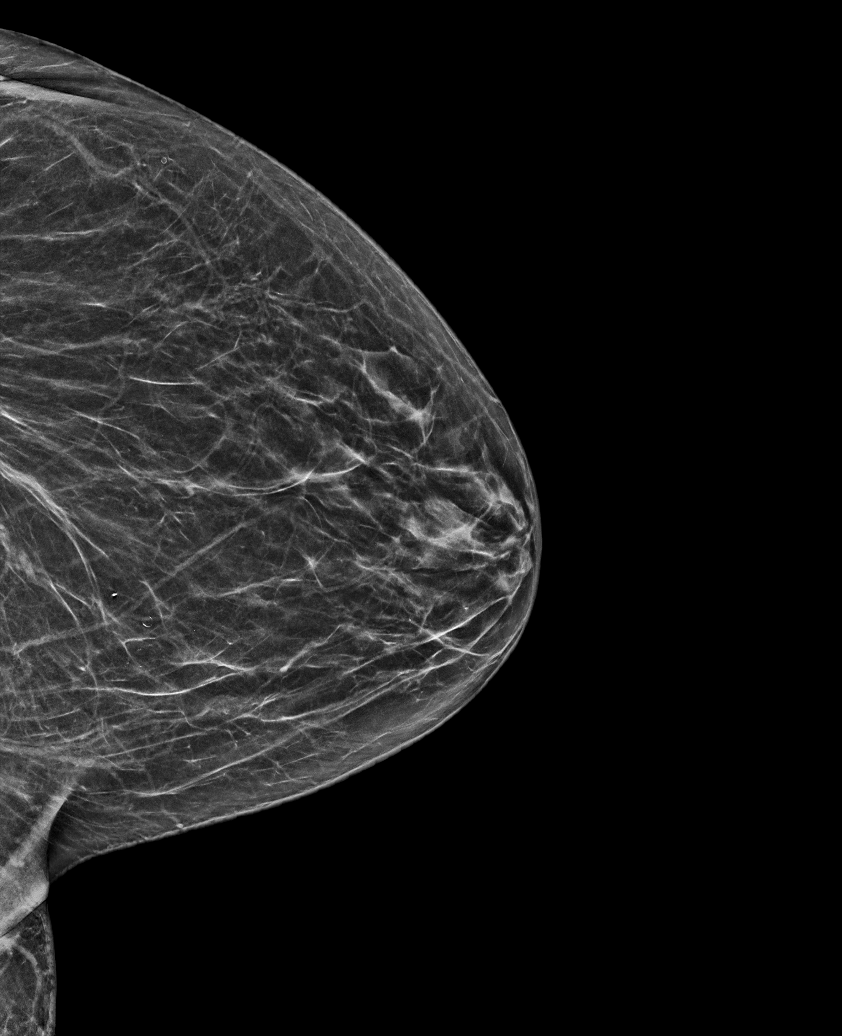

[L MLO synth-2D]
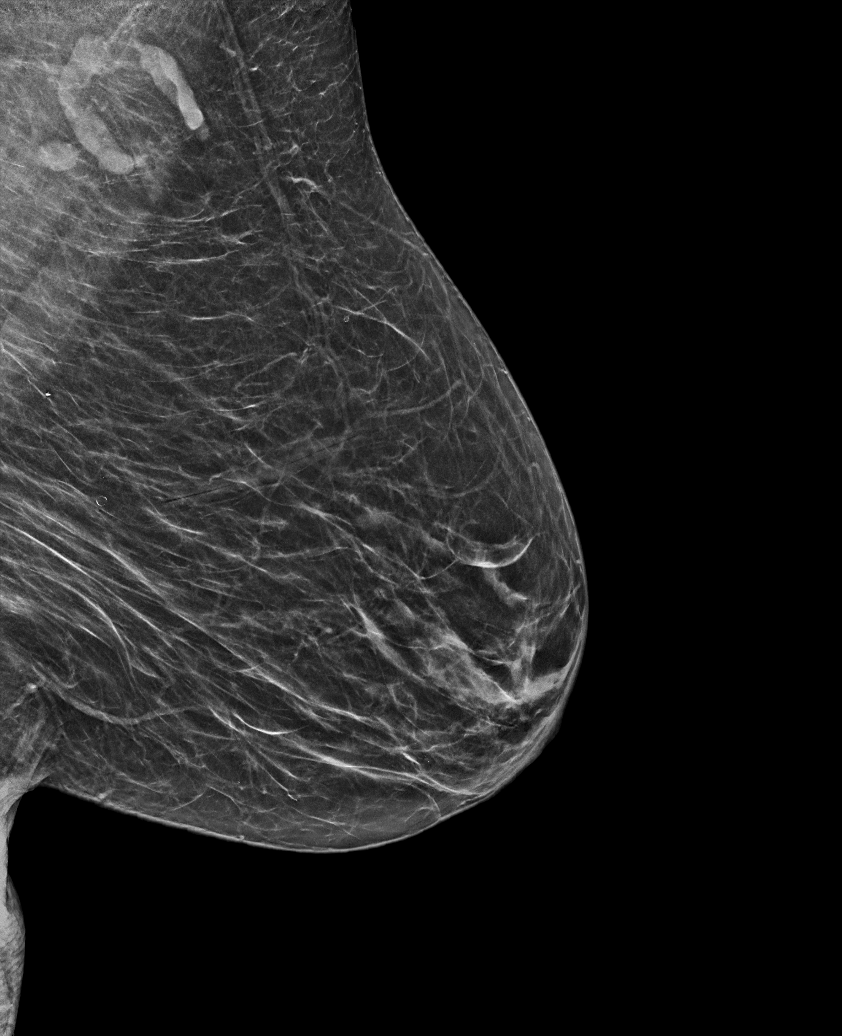

[R CC synth-2D]
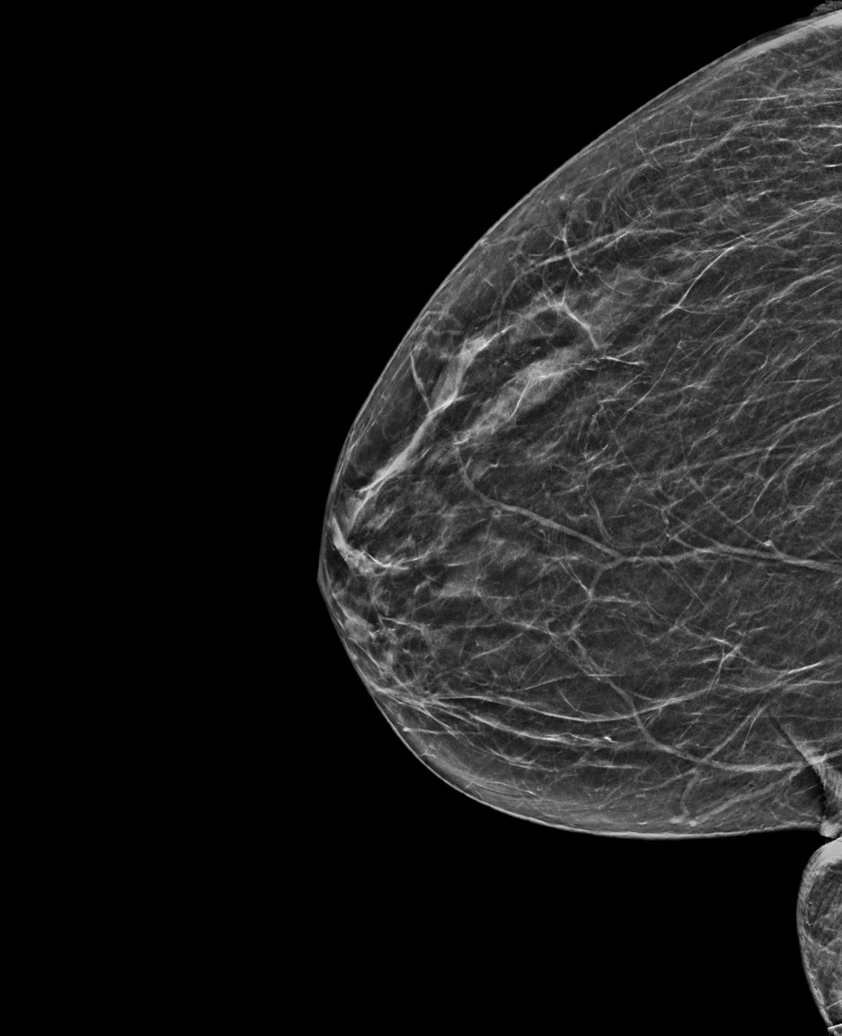

[R MLO synth-2D (1 of 2)]
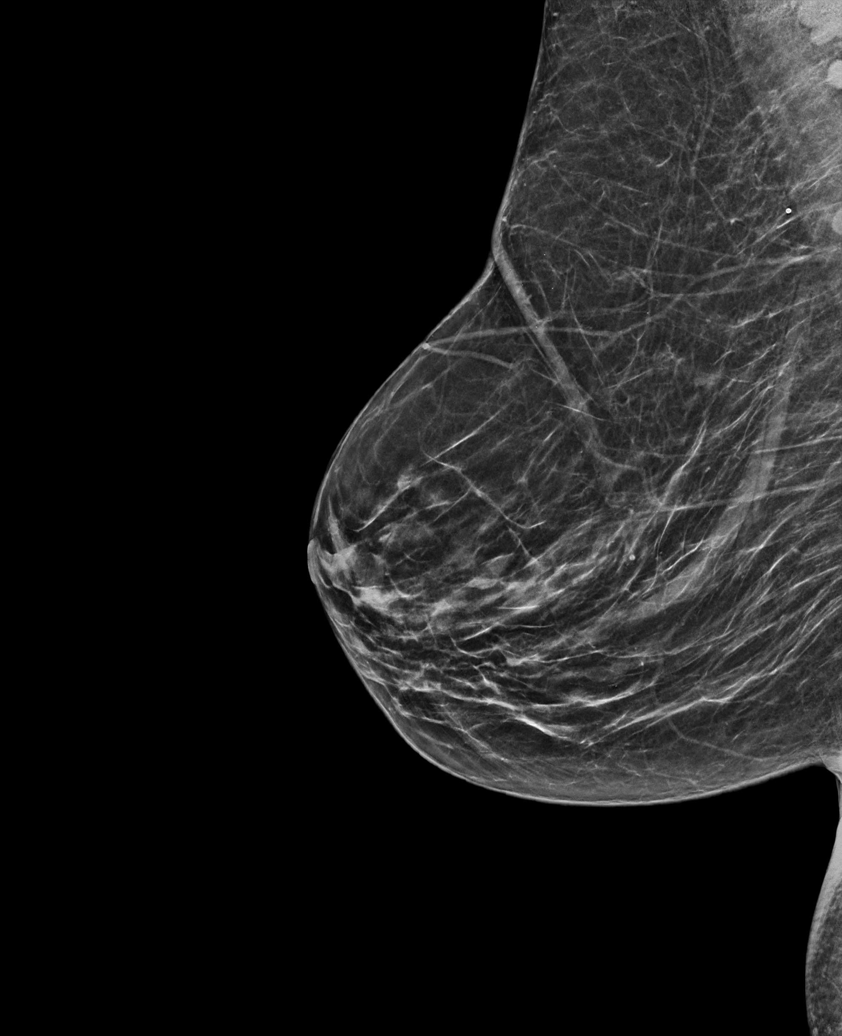

[R MLO synth-2D (2 of 2)]
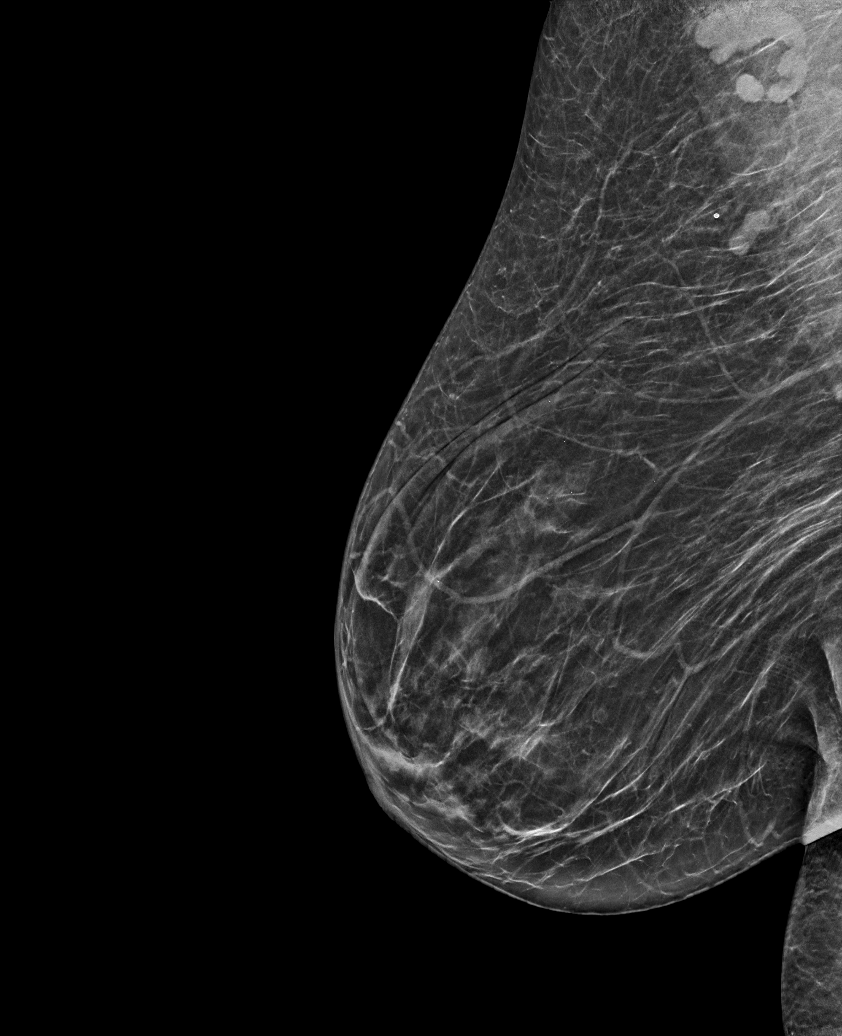

[R MLO tomo · tomo slice 25/49.0]
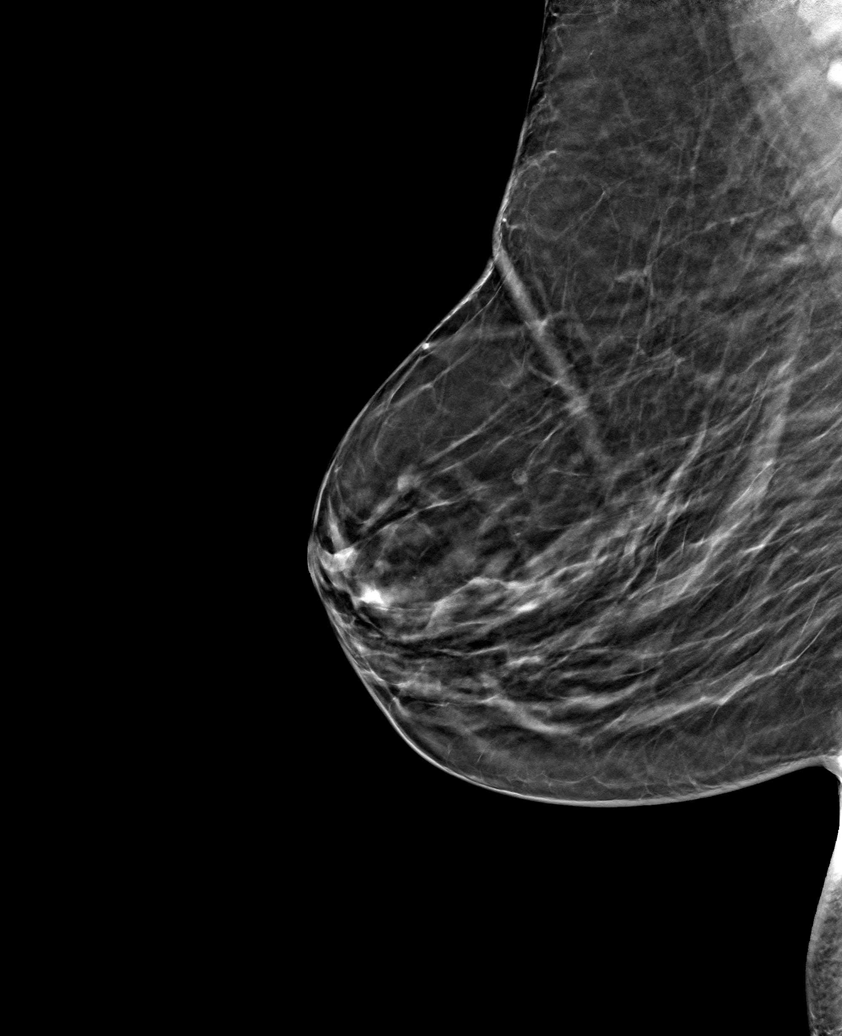

[6 of 30 positions shown; findings below may reference images not displayed]

ACR Breast Density Category b: There are scattered areas of
fibroglandular density.
FINDINGS: There are no findings suspicious for malignancy. The images were
evaluated with computer-aided detection.
IMPRESSION: No mammographic evidence of malignancy. A result letter of this
screening mammogram will be mailed directly to the patient.

RECOMMENDATION:
Screening mammogram in one year. (Code:WJ-[id number])

BI-RADS CATEGORY  1: Negative.

## 2022-01-01 ENCOUNTER — Ambulatory Visit: Payer: Self-pay | Admitting: *Deleted

## 2022-01-01 ENCOUNTER — Ambulatory Visit: Payer: No Typology Code available for payment source

## 2022-01-01 ENCOUNTER — Ambulatory Visit
Admission: RE | Admit: 2022-01-01 | Discharge: 2022-01-01 | Disposition: A | Discharge status: Home / Self Care | Payer: No Typology Code available for payment source | Source: Ambulatory Visit | Attending: Obstetrics and Gynecology | Admitting: Obstetrics and Gynecology

## 2022-01-01 VITALS — BP 130/82 | Wt 185.1 lb

## 2022-01-01 DIAGNOSIS — Z1239 Encounter for other screening for malignant neoplasm of breast: Secondary | ICD-10-CM

## 2022-01-01 DIAGNOSIS — Z1211 Encounter for screening for malignant neoplasm of colon: Secondary | ICD-10-CM

## 2022-01-01 DIAGNOSIS — N644 Mastodynia: Secondary | ICD-10-CM

## 2022-01-01 NOTE — Progress Notes (Signed)
[name] is a 66 y.o. female who presents to [facility] clinic today with complaint of left lower and nipple pain x 4 months that is constant. Patient rates the pain at a 8 out of 10 and stated that she takes Tylenol as needed to relieve pain.    Pap Smear: Pap smear not completed today. Last Pap smear was 10/14/2017 at [facility] clinic and was normal with negative HPV. Per patient has no history of an abnormal Pap smear. Patient has a history of a supracervical hysterectomy in 2005 due to AUB and fibroids. Last Pap smear result is in Epic.   Physical exam: Breasts Breasts symmetrical. No skin abnormalities bilateral breasts. No nipple retraction bilateral breasts. No nipple discharge bilateral breasts. No lymphadenopathy. No lumps palpated bilateral breasts. Complaints of left lower outer and nipple area pain on exam.     MS DIGITAL SCREENING TOMO BILATERAL  Result Date: 12/24/2020 CLINICAL DATA:  Screening. EXAM: DIGITAL SCREENING BILATERAL MAMMOGRAM WITH TOMOSYNTHESIS AND CAD TECHNIQUE: Bilateral screening digital craniocaudal and mediolateral oblique mammograms were obtained. Bilateral screening digital breast tomosynthesis was performed. The images were evaluated with computer-aided detection. COMPARISON:  Previous exam(s). ACR Breast Density Category b: There are scattered areas of fibroglandular density. FINDINGS: There are no findings suspicious for malignancy. The images were evaluated with computer-aided detection. IMPRESSION: No mammographic evidence of malignancy. A result letter of this screening mammogram will be mailed directly to the patient. RECOMMENDATION: Screening mammogram in one year. (Code:[id number]-01Y) BI-RADS CATEGORY  1: Negative. Electronically Signed   By: Amie Portland M.D.   On: 12/24/2020 09:12   MS DIGITAL DIAG TOMO BILAT  Result Date: 10/26/2019 CLINICAL DATA:  Follow-up probably benign mass in the 10 o'clock position of the right breast. Increasing constant focal pain  in the upper outer left breast for the past year. EXAM: DIGITAL DIAGNOSTIC BILATERAL MAMMOGRAM WITH CAD AND TOMO ULTRASOUND BILATERAL BREAST COMPARISON:  Previous exam(s). ACR Breast Density Category b: There are scattered areas of fibroglandular density. FINDINGS: Mammographically unremarkable right breast. No abnormality demonstrated in the left breast at the location focal pain in the upper outer left breast, marked with a metallic marker. In the oblique projection on the left, there is an elongated, oval, circumscribed mass-like density. This is not seen in the craniocaudal projection. Mammographic images were processed with CAD. On physical exam, no mass is palpable in the upper outer left breast at the location of focal pain and tenderness. There is no palpable mass in the retroareolar left breast. Targeted ultrasound is performed, showing normal appearing breast tissue in the upper outer left breast at the location of focal pain and tenderness, including fatty tissue and glandular tissue. Normal appearing breast tissue is also demonstrated in the retroareolar left breast, including normal appearing fibroglandular tissue. On the right, a 6 x 4 x 2 mm oval, horizontally oriented, circumscribed, hypoechoic mass is demonstrated in the 10 o'clock position, 3 cm from the nipple. This measured 7 x 4 x 2 mm on the initial ultrasound dated 10/14/2017. IMPRESSION: 1. No evidence of malignancy. 2. Normal appearing breast tissue in the upper outer left breast in the area focal pain and tenderness. 3. Stable small benign appearing mass in the 10 o'clock position of the right breast. The lack of change over a 2 year period of time is compatible with a benign process and this does not need further follow-up. RECOMMENDATION: Bilateral screening mammogram in 1 year. I have discussed the findings and recommendations with the patient.  If applicable, a reminder letter will be sent to the patient regarding the next appointment.  BI-RADS CATEGORY  2: Benign. Electronically Signed   By: Beckie Salts M.D.   On: 10/26/2019 11:18   MM DIAG BREAST TOMO UNI RIGHT  Result Date: 04/19/2018 CLINICAL DATA:  Six-month follow-up of probably benign mass in the right breast. EXAM: DIGITAL DIAGNOSTIC RIGHT MAMMOGRAM WITH CAD AND TOMO ULTRASOUND RIGHT BREAST COMPARISON:  October 14, 2017 an outside prior mammogram from October 11, 2014 ACR Breast Density Category b: There are scattered areas of fibroglandular density. FINDINGS: No suspicious mass, architectural distortion, or suspicious microcalcification in the right breast. Mammographic images were processed with CAD. Targeted ultrasound is performed, showing a circumscribed hypoechoic mass parallel to the chest wall at 10 o'clock position 3 cm from nipple measuring 0.6 x 0.2 x 0.4 cm. This mass is sonographically stable and has benign features. IMPRESSION: Stable probably benign mass in the right breast. RECOMMENDATION: Bilateral diagnostic mammogram and possible right breast ultrasound is recommended in March 2020. I have discussed the findings and recommendations with the patient. Results were also provided in writing at the conclusion of the visit. If applicable, a reminder letter will be sent to the patient regarding the next appointment. BI-RADS CATEGORY  3: Probably benign. Electronically Signed   By: Britta Mccreedy M.D.   On: 04/19/2018 16:20   MM DIAG BREAST TOMO BILATERAL  Addendum Date: 10/15/2017   ADDENDUM REPORT: 10/15/2017 12:02 ADDENDUM: The current study was compared to multiple previous studies from 2013, 2014, 2016. A six-month follow-up mammogram and ultrasound is still recommended of the probably benign mass. Electronically Signed   By: Gerome Sam III M.D   On: 10/15/2017 12:02   Result Date: 10/15/2017 CLINICAL DATA:  Diffuse pain in the lateral left breast. EXAM: DIGITAL DIAGNOSTIC BILATERAL MAMMOGRAM WITH CAD AND TOMO ULTRASOUND RIGHT BREAST COMPARISON:  Previous exam(s).  ACR Breast Density Category b: There are scattered areas of fibroglandular density. FINDINGS: There are benign calcifications in the lateral central right breast which layer on 90 degree imaging. No suspicious findings seen in the left breast. There is a possible mass in the lateral central right breast best seen on the CC view. No other suspicious mammographic findings. Mammographic images were processed with CAD. On physical exam, no suspicious lumps are identified. Targeted ultrasound is performed, showing a probably benign mass in the right breast at 10 o'clock, 3 cm from the nipple measuring 7 x 2 by 4 mm. This may correlate with the mammographic finding. IMPRESSION: Probably benign right breast mass.  No other suspicious findings. RECOMMENDATION: Six-month follow-up mammogram and ultrasound of the probably benign right breast mass. I have discussed the findings and recommendations with the patient. Results were also provided in writing at the conclusion of the visit. If applicable, a reminder letter will be sent to the patient regarding the next appointment. BI-RADS CATEGORY  3: Probably benign. Electronically Signed: By: Gerome Sam III M.D On: 10/14/2017 12:14    Pelvic/Bimanual Pap is not indicated today per BCCCP guidelines.   Smoking History: Patient is a former smoker that quit in 2009 per patient.   Patient Navigation: Patient education provided. Access to services provided for patient through B[facility]CP program.   Colorectal Cancer Screening: Per patient has never had colonoscopy completed. Patient stated she completed a FIT Test around a year ago but did not receive the results. FIT Test given to patient to complete. No complaints today.    Breast and Cervical  Cancer Risk Assessment: Patient does not have family history of breast cancer, known genetic mutations, or radiation treatment to the chest before age 34. Patient does not have history of cervical dysplasia, immunocompromised, or  DES exposure in-utero.  Risk Assessment     Risk Scores       01/01/2022 12/19/2020   Last edited by: Meryl Dare, CMA [place], Sherie Demetrius Charity, LPN   5-year risk: 1.6 % 1.5 %   Lifetime risk: 6.3 % 6.5 %            A: B[facility]CP exam without pap smear Complaint of left lower and nipple area breast pain.  P: Referred patient to the [facility] for a diagnostic mammogram. Appointment scheduled Thursday, January 01, 2022 at 1240.  Priscille Heidelberg, RN 01/01/2022 11:01 AM

## 2022-01-01 NOTE — Patient Instructions (Signed)
Explained breast self awareness with Francoise Ceo. Patient did not need a Pap smear today due to last Pap smear and HPV typing was 10/14/2017. Let her know [facility] will cover Pap smears and HPV typing every 5 years unless has a history of abnormal Pap smears. Referred patient to the [facility] for a diagnostic mammogram. Appointment scheduled Thursday, January 01, 2022 at 1240. Patient aware of appointment and will be there. Francoise Ceo verbalized understanding.  Duane Trias, Kathaleen Maser, RN 11:01 AM

## 2023-02-03 ENCOUNTER — Other Ambulatory Visit: Payer: Self-pay

## 2023-02-03 DIAGNOSIS — N644 Mastodynia: Secondary | ICD-10-CM

## 2023-02-18 ENCOUNTER — Ambulatory Visit: Payer: Self-pay | Admitting: Hematology and Oncology

## 2023-02-18 ENCOUNTER — Ambulatory Visit: Payer: Self-pay

## 2023-02-18 ENCOUNTER — Ambulatory Visit
Admission: RE | Admit: 2023-02-18 | Discharge: 2023-02-18 | Disposition: A | Discharge status: Home / Self Care | Payer: No Typology Code available for payment source | Source: Ambulatory Visit | Attending: Obstetrics and Gynecology | Admitting: Obstetrics and Gynecology

## 2023-02-18 VITALS — BP 115/75 | Wt 200.0 lb

## 2023-02-18 DIAGNOSIS — Z01419 Encounter for gynecological examination (general) (routine) without abnormal findings: Secondary | ICD-10-CM

## 2023-02-18 DIAGNOSIS — N644 Mastodynia: Secondary | ICD-10-CM

## 2023-02-18 DIAGNOSIS — Z1211 Encounter for screening for malignant neoplasm of colon: Secondary | ICD-10-CM

## 2023-02-18 DIAGNOSIS — Z1239 Encounter for other screening for malignant neoplasm of breast: Secondary | ICD-10-CM

## 2023-02-18 NOTE — Patient Instructions (Addendum)
Taught Francoise Ceo about self breast awareness and gave educational materials to take home. Patient did need a Pap smear today due to last Pap smear was in 2019 per patient. Let her know [facility] will cover Pap smears every 3 years unless has a history of abnormal Pap smears. Referred patient to the [facility] for screening mammogram. Appointment scheduled for 02/18/2023. Patient aware of appointment and will be there. Let patient know will follow up with her within the next couple weeks with results. Francoise Ceo verbalized understanding.  Pascal Lux, NP 1:39 PM

## 2023-02-18 NOTE — Progress Notes (Signed)
[name] is a 67 y.o. [id number] female who presents to [facility] clinic today with no complaints.    Pap Smear: Pap smear completed today. Last Pap smear was 2019 and was normal. Per patient has no history of an abnormal Pap smear. Last Pap smear result is available in Epic. She will not need another Pap after this one due to age.    Physical exam: Breasts Breasts symmetrical. No skin abnormalities bilateral breasts. No nipple retraction bilateral breasts. No nipple discharge bilateral breasts. No lymphadenopathy. No lumps palpated bilateral breasts.  MS DIGITAL DIAG TOMO BILAT  Result Date: 01/01/2022 CLINICAL DATA:  Patient reports diffuse pain in the LATERAL aspect of the LEFT breast. Recent 80 pound weight loss over 1 year. EXAM: DIGITAL DIAGNOSTIC BILATERAL MAMMOGRAM WITH TOMOSYNTHESIS AND CAD TECHNIQUE: Bilateral digital diagnostic mammography and breast tomosynthesis was performed. The images were evaluated with computer-aided detection. COMPARISON:  Previous exam(s). ACR Breast Density Category b: There are scattered areas of fibroglandular density. FINDINGS: No suspicious mass, distortion, or microcalcifications are identified to suggest presence of malignancy. IMPRESSION: No mammographic evidence for malignancy. RECOMMENDATION: Screening mammogram in one year.(Code:SM-B-01Y) I have discussed the findings and recommendations with the patient. If applicable, a reminder letter will be sent to the patient regarding the next appointment. BI-RADS CATEGORY  1: Negative. Electronically Signed   By: Norva Pavlov M.D.   On: 01/01/2022 13:03  MS DIGITAL SCREENING TOMO BILATERAL  Result Date: 12/24/2020 CLINICAL DATA:  Screening. EXAM: DIGITAL SCREENING BILATERAL MAMMOGRAM WITH TOMOSYNTHESIS AND CAD TECHNIQUE: Bilateral screening digital craniocaudal and mediolateral oblique mammograms were obtained. Bilateral screening digital breast tomosynthesis was performed. The images were evaluated with  computer-aided detection. COMPARISON:  Previous exam(s). ACR Breast Density Category b: There are scattered areas of fibroglandular density. FINDINGS: There are no findings suspicious for malignancy. The images were evaluated with computer-aided detection. IMPRESSION: No mammographic evidence of malignancy. A result letter of this screening mammogram will be mailed directly to the patient. RECOMMENDATION: Screening mammogram in one year. (Code:SM-B-01Y) BI-RADS CATEGORY  1: Negative. Electronically Signed   By: Amie Portland M.D.   On: 12/24/2020 09:12   MS DIGITAL DIAG TOMO BILAT  Result Date: 10/26/2019 CLINICAL DATA:  Follow-up probably benign mass in the 10 o'clock position of the right breast. Increasing constant focal pain in the upper outer left breast for the past year. EXAM: DIGITAL DIAGNOSTIC BILATERAL MAMMOGRAM WITH CAD AND TOMO ULTRASOUND BILATERAL BREAST COMPARISON:  Previous exam(s). ACR Breast Density Category b: There are scattered areas of fibroglandular density. FINDINGS: Mammographically unremarkable right breast. No abnormality demonstrated in the left breast at the location focal pain in the upper outer left breast, marked with a metallic marker. In the oblique projection on the left, there is an elongated, oval, circumscribed mass-like density. This is not seen in the craniocaudal projection. Mammographic images were processed with CAD. On physical exam, no mass is palpable in the upper outer left breast at the location of focal pain and tenderness. There is no palpable mass in the retroareolar left breast. Targeted ultrasound is performed, showing normal appearing breast tissue in the upper outer left breast at the location of focal pain and tenderness, including fatty tissue and glandular tissue. Normal appearing breast tissue is also demonstrated in the retroareolar left breast, including normal appearing fibroglandular tissue. On the right, a 6 x 4 x 2 mm oval, horizontally oriented,  circumscribed, hypoechoic mass is demonstrated in the 10 o'clock position, 3 cm from the nipple. This  measured 7 x 4 x 2 mm on the initial ultrasound dated 10/14/2017. IMPRESSION: 1. No evidence of malignancy. 2. Normal appearing breast tissue in the upper outer left breast in the area focal pain and tenderness. 3. Stable small benign appearing mass in the 10 o'clock position of the right breast. The lack of change over a 2 year period of time is compatible with a benign process and this does not need further follow-up. RECOMMENDATION: Bilateral screening mammogram in 1 year. I have discussed the findings and recommendations with the patient. If applicable, a reminder letter will be sent to the patient regarding the next appointment. BI-RADS CATEGORY  2: Benign. Electronically Signed   By: Beckie Salts M.D.   On: 10/26/2019 11:18   MM DIAG BREAST TOMO UNI RIGHT  Result Date: 04/19/2018 CLINICAL DATA:  Six-month follow-up of probably benign mass in the right breast. EXAM: DIGITAL DIAGNOSTIC RIGHT MAMMOGRAM WITH CAD AND TOMO ULTRASOUND RIGHT BREAST COMPARISON:  October 14, 2017 an outside prior mammogram from October 11, 2014 ACR Breast Density Category b: There are scattered areas of fibroglandular density. FINDINGS: No suspicious mass, architectural distortion, or suspicious microcalcification in the right breast. Mammographic images were processed with CAD. Targeted ultrasound is performed, showing a circumscribed hypoechoic mass parallel to the chest wall at 10 o'clock position 3 cm from nipple measuring 0.6 x 0.2 x 0.4 cm. This mass is sonographically stable and has benign features. IMPRESSION: Stable probably benign mass in the right breast. RECOMMENDATION: Bilateral diagnostic mammogram and possible right breast ultrasound is recommended in March 2020. I have discussed the findings and recommendations with the patient. Results were also provided in writing at the conclusion of the visit. If applicable, a  reminder letter will be sent to the patient regarding the next appointment. BI-RADS CATEGORY  3: Probably benign. Electronically Signed   By: Britta Mccreedy M.D.   On: 04/19/2018 16:20         Pelvic/Bimanual Ext Genitalia No lesions, no swelling and no discharge observed on external genitalia.        Vagina Vagina pink and normal texture. No lesions or discharge observed in vagina.        Cervix Cervix is present. Cervix pink and of normal texture. No discharge observed.    Uterus Uterus is present and palpable. Uterus in normal position and normal size.        Adnexae Bilateral ovaries present and palpable. No tenderness on palpation.         Rectovaginal No rectal exam completed today since patient had no rectal complaints. No skin abnormalities observed on exam.     Smoking History: Patient has never smoked and was not referred to quit line.    Patient Navigation: Patient education provided. Access to services provided for patient through [facility] program. No interpreter provided. No transportation provided   Colorectal Cancer Screening: Per patient has never had colonoscopy completed No complaints today.    Breast and Cervical Cancer Risk Assessment: Patient does not have family history of breast cancer, known genetic mutations, or radiation treatment to the chest before age 79. Patient does not have history of cervical dysplasia, immunocompromised, or DES exposure in-utero.  Risk Assessment   No risk assessment data for the current encounter  Risk Scores       01/01/2022   Last edited by: Meryl Dare, CMA   5-year risk: 1.6%   Lifetime risk: 6.3%            A:  [facility] exam with pap smear No complaints with benign exam.   P: Referred [facility] to [facility] for a screening mammogram. Appointment scheduled 02/18/2023.  Ilda Basset A, NP 02/18/2023 2:15 PM

## 2023-08-27 DIAGNOSIS — H524 Presbyopia: Secondary | ICD-10-CM | POA: Diagnosis not present

## 2023-08-27 DIAGNOSIS — H5213 Myopia, bilateral: Secondary | ICD-10-CM | POA: Diagnosis not present

## 2023-08-27 DIAGNOSIS — H52209 Unspecified astigmatism, unspecified eye: Secondary | ICD-10-CM | POA: Diagnosis not present

## 2023-11-23 DIAGNOSIS — Z13220 Encounter for screening for lipoid disorders: Secondary | ICD-10-CM | POA: Diagnosis not present

## 2023-11-23 DIAGNOSIS — Z0000 Encounter for general adult medical examination without abnormal findings: Secondary | ICD-10-CM | POA: Diagnosis not present

## 2023-11-23 DIAGNOSIS — I499 Cardiac arrhythmia, unspecified: Secondary | ICD-10-CM | POA: Diagnosis not present

## 2023-11-23 DIAGNOSIS — Z1329 Encounter for screening for other suspected endocrine disorder: Secondary | ICD-10-CM | POA: Diagnosis not present

## 2023-11-23 DIAGNOSIS — Z136 Encounter for screening for cardiovascular disorders: Secondary | ICD-10-CM | POA: Diagnosis not present

## 2023-11-23 DIAGNOSIS — Z130 Encounter for screening for diseases of the blood and blood-forming organs and certain disorders involving the immune mechanism: Secondary | ICD-10-CM | POA: Diagnosis not present

## 2023-11-23 DIAGNOSIS — Z131 Encounter for screening for diabetes mellitus: Secondary | ICD-10-CM | POA: Diagnosis not present

## 2023-11-23 DIAGNOSIS — I4892 Unspecified atrial flutter: Secondary | ICD-10-CM | POA: Diagnosis not present

## 2023-11-23 DIAGNOSIS — R9431 Abnormal electrocardiogram [ECG] [EKG]: Secondary | ICD-10-CM | POA: Diagnosis not present

## 2023-11-23 DIAGNOSIS — E559 Vitamin D deficiency, unspecified: Secondary | ICD-10-CM | POA: Diagnosis not present

## 2023-11-23 DIAGNOSIS — R635 Abnormal weight gain: Secondary | ICD-10-CM | POA: Diagnosis not present

## 2023-11-23 DIAGNOSIS — F439 Reaction to severe stress, unspecified: Secondary | ICD-10-CM | POA: Diagnosis not present

## 2023-11-25 ENCOUNTER — Other Ambulatory Visit: Payer: Self-pay | Admitting: Family Medicine

## 2023-11-25 DIAGNOSIS — E2839 Other primary ovarian failure: Secondary | ICD-10-CM

## 2023-11-28 ENCOUNTER — Emergency Department (HOSPITAL_BASED_OUTPATIENT_CLINIC_OR_DEPARTMENT_OTHER)

## 2023-11-28 ENCOUNTER — Emergency Department (HOSPITAL_COMMUNITY)

## 2023-11-28 ENCOUNTER — Inpatient Hospital Stay (HOSPITAL_COMMUNITY)
Admission: EM | Admit: 2023-11-28 | Discharge: 2023-12-02 | DRG: 308 | Disposition: A | Discharge status: Home / Self Care | Attending: Internal Medicine | Admitting: Internal Medicine

## 2023-11-28 ENCOUNTER — Encounter (HOSPITAL_COMMUNITY): Payer: Self-pay

## 2023-11-28 ENCOUNTER — Other Ambulatory Visit: Payer: Self-pay

## 2023-11-28 DIAGNOSIS — I9589 Other hypotension: Secondary | ICD-10-CM | POA: Diagnosis present

## 2023-11-28 DIAGNOSIS — Z91411 Personal history of adult psychological abuse: Secondary | ICD-10-CM

## 2023-11-28 DIAGNOSIS — Z8249 Family history of ischemic heart disease and other diseases of the circulatory system: Secondary | ICD-10-CM

## 2023-11-28 DIAGNOSIS — Z90710 Acquired absence of both cervix and uterus: Secondary | ICD-10-CM

## 2023-11-28 DIAGNOSIS — I110 Hypertensive heart disease with heart failure: Secondary | ICD-10-CM | POA: Diagnosis not present

## 2023-11-28 DIAGNOSIS — J123 Human metapneumovirus pneumonia: Secondary | ICD-10-CM | POA: Diagnosis present

## 2023-11-28 DIAGNOSIS — I340 Nonrheumatic mitral (valve) insufficiency: Secondary | ICD-10-CM | POA: Diagnosis not present

## 2023-11-28 DIAGNOSIS — I10 Essential (primary) hypertension: Secondary | ICD-10-CM | POA: Diagnosis not present

## 2023-11-28 DIAGNOSIS — Z9049 Acquired absence of other specified parts of digestive tract: Secondary | ICD-10-CM

## 2023-11-28 DIAGNOSIS — Z833 Family history of diabetes mellitus: Secondary | ICD-10-CM | POA: Diagnosis not present

## 2023-11-28 DIAGNOSIS — I4892 Unspecified atrial flutter: Secondary | ICD-10-CM | POA: Diagnosis present

## 2023-11-28 DIAGNOSIS — I2489 Other forms of acute ischemic heart disease: Secondary | ICD-10-CM | POA: Diagnosis present

## 2023-11-28 DIAGNOSIS — J189 Pneumonia, unspecified organism: Secondary | ICD-10-CM | POA: Diagnosis present

## 2023-11-28 DIAGNOSIS — E876 Hypokalemia: Secondary | ICD-10-CM | POA: Diagnosis present

## 2023-11-28 DIAGNOSIS — Z91018 Allergy to other foods: Secondary | ICD-10-CM

## 2023-11-28 DIAGNOSIS — I5041 Acute combined systolic (congestive) and diastolic (congestive) heart failure: Secondary | ICD-10-CM | POA: Diagnosis present

## 2023-11-28 DIAGNOSIS — M7989 Other specified soft tissue disorders: Secondary | ICD-10-CM | POA: Diagnosis not present

## 2023-11-28 DIAGNOSIS — I483 Typical atrial flutter: Secondary | ICD-10-CM | POA: Diagnosis present

## 2023-11-28 DIAGNOSIS — Z79899 Other long term (current) drug therapy: Secondary | ICD-10-CM

## 2023-11-28 DIAGNOSIS — I5020 Unspecified systolic (congestive) heart failure: Secondary | ICD-10-CM | POA: Diagnosis not present

## 2023-11-28 DIAGNOSIS — I824Y2 Acute embolism and thrombosis of unspecified deep veins of left proximal lower extremity: Secondary | ICD-10-CM | POA: Diagnosis not present

## 2023-11-28 DIAGNOSIS — R918 Other nonspecific abnormal finding of lung field: Secondary | ICD-10-CM | POA: Diagnosis not present

## 2023-11-28 DIAGNOSIS — Z713 Dietary counseling and surveillance: Secondary | ICD-10-CM

## 2023-11-28 DIAGNOSIS — I4891 Unspecified atrial fibrillation: Secondary | ICD-10-CM | POA: Diagnosis not present

## 2023-11-28 DIAGNOSIS — I959 Hypotension, unspecified: Secondary | ICD-10-CM

## 2023-11-28 DIAGNOSIS — I5021 Acute systolic (congestive) heart failure: Secondary | ICD-10-CM

## 2023-11-28 DIAGNOSIS — Z1152 Encounter for screening for COVID-19: Secondary | ICD-10-CM

## 2023-11-28 DIAGNOSIS — Z87891 Personal history of nicotine dependence: Secondary | ICD-10-CM | POA: Diagnosis not present

## 2023-11-28 DIAGNOSIS — Z7901 Long term (current) use of anticoagulants: Secondary | ICD-10-CM | POA: Diagnosis not present

## 2023-11-28 DIAGNOSIS — N179 Acute kidney failure, unspecified: Secondary | ICD-10-CM | POA: Diagnosis not present

## 2023-11-28 DIAGNOSIS — J984 Other disorders of lung: Secondary | ICD-10-CM | POA: Diagnosis not present

## 2023-11-28 DIAGNOSIS — R42 Dizziness and giddiness: Secondary | ICD-10-CM | POA: Diagnosis not present

## 2023-11-28 DIAGNOSIS — I82442 Acute embolism and thrombosis of left tibial vein: Secondary | ICD-10-CM | POA: Diagnosis present

## 2023-11-28 DIAGNOSIS — E860 Dehydration: Secondary | ICD-10-CM | POA: Diagnosis present

## 2023-11-28 DIAGNOSIS — D649 Anemia, unspecified: Secondary | ICD-10-CM | POA: Diagnosis present

## 2023-11-28 DIAGNOSIS — R000 Tachycardia, unspecified: Secondary | ICD-10-CM | POA: Diagnosis not present

## 2023-11-28 DIAGNOSIS — Z8701 Personal history of pneumonia (recurrent): Secondary | ICD-10-CM | POA: Diagnosis not present

## 2023-11-28 DIAGNOSIS — R55 Syncope and collapse: Secondary | ICD-10-CM | POA: Diagnosis present

## 2023-11-28 DIAGNOSIS — J168 Pneumonia due to other specified infectious organisms: Secondary | ICD-10-CM | POA: Diagnosis not present

## 2023-11-28 DIAGNOSIS — R7989 Other specified abnormal findings of blood chemistry: Secondary | ICD-10-CM | POA: Diagnosis not present

## 2023-11-28 DIAGNOSIS — Z91010 Allergy to peanuts: Secondary | ICD-10-CM

## 2023-11-28 LAB — CBC WITH DIFFERENTIAL/PLATELET
Abs Immature Granulocytes: 0.04 10*3/uL (ref 0.00–0.07)
Basophils Absolute: 0 10*3/uL (ref 0.0–0.1)
Basophils Relative: 1 %
Eosinophils Absolute: 0.1 10*3/uL (ref 0.0–0.5)
Eosinophils Relative: 2 %
HCT: 38 % (ref 36.0–46.0)
Hemoglobin: 12.2 g/dL (ref 12.0–15.0)
Immature Granulocytes: 1 %
Lymphocytes Relative: 21 %
Lymphs Abs: 0.9 10*3/uL (ref 0.7–4.0)
MCH: 27.3 pg (ref 26.0–34.0)
MCHC: 32.1 g/dL (ref 30.0–36.0)
MCV: 85 fL (ref 80.0–100.0)
Monocytes Absolute: 0.6 10*3/uL (ref 0.1–1.0)
Monocytes Relative: 13 %
Neutro Abs: 2.6 10*3/uL (ref 1.7–7.7)
Neutrophils Relative %: 62 %
Platelets: 362 10*3/uL (ref 150–400)
RBC: 4.47 MIL/uL (ref 3.87–5.11)
RDW: 15.4 % (ref 11.5–15.5)
WBC: 4.3 10*3/uL (ref 4.0–10.5)
nRBC: 0 % (ref 0.0–0.2)

## 2023-11-28 LAB — BASIC METABOLIC PANEL WITH GFR
Anion gap: 14 (ref 5–15)
BUN: 21 mg/dL (ref 8–23)
CO2: 20 mmol/L — ABNORMAL LOW (ref 22–32)
Calcium: 9.2 mg/dL (ref 8.9–10.3)
Chloride: 107 mmol/L (ref 98–111)
Creatinine, Ser: 0.88 mg/dL (ref 0.44–1.00)
GFR, Estimated: >60 mL/min (ref >60)
Glucose, Bld: 104 mg/dL — ABNORMAL HIGH (ref 70–99)
Potassium: 3.4 mmol/L — ABNORMAL LOW (ref 3.5–5.1)
Sodium: 141 mmol/L (ref 135–145)

## 2023-11-28 LAB — TROPONIN I (HIGH SENSITIVITY)
Troponin I (High Sensitivity): 24 ng/L — ABNORMAL HIGH (ref <18)
Troponin I (High Sensitivity): 69 ng/L — ABNORMAL HIGH (ref <18)
Troponin I (High Sensitivity): 42 ng/L — ABNORMAL HIGH (ref <18)

## 2023-11-28 LAB — MAGNESIUM: Magnesium: 1.7 mg/dL (ref 1.7–2.4)

## 2023-11-28 LAB — RESP PANEL BY RT-PCR (RSV, FLU A&B, COVID)  RVPGX2
Influenza A by PCR: NEGATIVE
Influenza B by PCR: NEGATIVE
Resp Syncytial Virus by PCR: NEGATIVE
SARS Coronavirus 2 by RT PCR: NEGATIVE

## 2023-11-28 MED ORDER — ONDANSETRON HCL 4 MG/2ML IJ SOLN: 4.0000 mg | Freq: Four times a day (QID) | INTRAMUSCULAR | Status: DC | PRN

## 2023-11-28 MED ORDER — APIXABAN 5 MG PO TABS
5.0000 mg | ORAL_TABLET | Freq: Two times a day (BID) | ORAL | Status: DC
  Administered 2023-11-28: 5 mg via ORAL
  Filled 2023-11-28: qty 1

## 2023-11-28 MED ORDER — ACETAMINOPHEN 325 MG PO TABS
650.0000 mg | ORAL_TABLET | Freq: Four times a day (QID) | ORAL | Status: DC | PRN
  Administered 2023-11-30: 650 mg via ORAL
  Filled 2023-11-28: qty 2

## 2023-11-28 MED ORDER — METOPROLOL TARTRATE 5 MG/5ML IV SOLN: 2.5000 mg | INTRAVENOUS | Status: DC | PRN

## 2023-11-28 MED ORDER — DOXYCYCLINE HYCLATE 100 MG PO TABS
100.0000 mg | ORAL_TABLET | Freq: Two times a day (BID) | ORAL | Status: DC
  Administered 2023-11-29 – 2023-12-02 (×7): 100 mg via ORAL
  Filled 2023-11-28 (×7): qty 1

## 2023-11-28 MED ORDER — MAGNESIUM OXIDE -MG SUPPLEMENT 400 (240 MG) MG PO TABS
400.0000 mg | ORAL_TABLET | Freq: Once | ORAL | Status: AC
  Administered 2023-11-28: 400 mg via ORAL
  Filled 2023-11-28: qty 1

## 2023-11-28 MED ORDER — SODIUM CHLORIDE 0.9 % IV SOLN
1.0000 g | Freq: Once | INTRAVENOUS | Status: AC
  Administered 2023-11-28: 1 g via INTRAVENOUS
  Filled 2023-11-28: qty 10

## 2023-11-28 MED ORDER — SODIUM CHLORIDE 0.9 % IV SOLN: 2.0000 g | INTRAVENOUS | Status: DC

## 2023-11-28 MED ORDER — SENNOSIDES-DOCUSATE SODIUM 8.6-50 MG PO TABS: 1.0000 | ORAL_TABLET | Freq: Every evening | ORAL | Status: DC | PRN

## 2023-11-28 MED ORDER — APIXABAN 5 MG PO TABS: 5.0000 mg | ORAL_TABLET | Freq: Two times a day (BID) | ORAL | Status: DC

## 2023-11-28 MED ORDER — LACTATED RINGERS IV BOLUS
1000.0000 mL | Freq: Once | INTRAVENOUS | Status: AC
  Administered 2023-11-28: 1000 mL via INTRAVENOUS

## 2023-11-28 MED ORDER — SODIUM CHLORIDE 0.9% FLUSH
3.0000 mL | Freq: Two times a day (BID) | INTRAVENOUS | Status: DC
  Administered 2023-11-28 – 2023-12-02 (×8): 3 mL via INTRAVENOUS

## 2023-11-28 MED ORDER — BISACODYL 5 MG PO TBEC: 5.0000 mg | DELAYED_RELEASE_TABLET | Freq: Every day | ORAL | Status: DC | PRN

## 2023-11-28 MED ORDER — IOHEXOL 350 MG/ML SOLN
75.0000 mL | Freq: Once | INTRAVENOUS | Status: AC | PRN
  Administered 2023-11-28: 75 mL via INTRAVENOUS

## 2023-11-28 MED ORDER — APIXABAN 5 MG PO TABS
10.0000 mg | ORAL_TABLET | Freq: Two times a day (BID) | ORAL | Status: DC
  Administered 2023-11-29 – 2023-12-02 (×7): 10 mg via ORAL
  Filled 2023-11-28 (×8): qty 2

## 2023-11-28 MED ORDER — APIXABAN 5 MG PO TABS
5.0000 mg | ORAL_TABLET | Freq: Once | ORAL | Status: AC
  Administered 2023-11-28: 5 mg via ORAL
  Filled 2023-11-28: qty 1

## 2023-11-28 MED ORDER — LACTATED RINGERS IV SOLN: INTRAVENOUS | Status: AC

## 2023-11-28 MED ORDER — POTASSIUM CHLORIDE 20 MEQ PO PACK
40.0000 meq | PACK | Freq: Once | ORAL | Status: AC
  Administered 2023-11-28: 40 meq via ORAL
  Filled 2023-11-28: qty 2

## 2023-11-28 MED ORDER — SODIUM CHLORIDE 0.9 % IV SOLN
500.0000 mg | Freq: Once | INTRAVENOUS | Status: AC
  Administered 2023-11-28: 500 mg via INTRAVENOUS
  Filled 2023-11-28: qty 5

## 2023-11-28 MED ORDER — ACETAMINOPHEN 650 MG RE SUPP: 650.0000 mg | Freq: Four times a day (QID) | RECTAL | Status: DC | PRN

## 2023-11-28 NOTE — ED Provider Notes (Addendum)
 [place] EMERGENCY DEPARTMENT AT [facility] Provider Note   CSN: [id number] Arrival date & time: 11/28/23  1205     History  Chief Complaint  Patient presents with   Near Syncope    [name] is a 68 y.o. female.  68 year old female presents today for concern of near syncopal episode that occurred at church.  She was sitting in a car and after she finished her 2 songs she states she had to sit down in a hurry due to the symptoms.  She states even prior to going to church she had some lightheadedness when she was getting dressed.  She has felt some palpitations since this morning.  No chest pain.  She states she does have history of "flutter" but she was prescribed anticoagulation just this Friday but she has not started this.  She states she wanted to do some research prior to taking it.  The history is provided by the patient. No language interpreter was used.       Home Medications Prior to Admission medications   Medication Sig Start Date End Date Taking? Authorizing Provider  cholecalciferol (VITAMIN D3) 25 MCG (1000 UNIT) tablet Take 1,000 Units by mouth daily.    [provider]  saw palmetto 160 MG capsule Take 160 mg by mouth 2 (two) times daily.    [provider]      Allergies    Peanut (diagnostic) and Coconut (cocos nucifera)    Review of Systems   Review of Systems  Constitutional:  Negative for fever.  Respiratory:  Positive for cough and shortness of breath.   Cardiovascular:  Positive for palpitations. Negative for chest pain.  Neurological:  Negative for light-headedness.  All other systems reviewed and are negative.   Physical Exam Updated Vital Signs BP (!) 86/66   Pulse (!) 56   Temp 99 F (37.2 C) (Oral)   Resp 19   Ht 5\' 4"  (1.626 m)   Wt 91 kg   SpO2 97%   BMI 34.44 kg/m  Physical Exam Vitals and nursing note reviewed.  Constitutional:      General: She is not in acute distress.    Appearance:  Normal appearance. She is not ill-appearing.  HENT:     Head: Normocephalic and atraumatic.     Nose: Nose normal.  Eyes:     Conjunctiva/sclera: Conjunctivae normal.  Cardiovascular:     Rate and Rhythm: Normal rate and regular rhythm.  Pulmonary:     Effort: Pulmonary effort is normal. No respiratory distress.     Breath sounds: Normal breath sounds. No wheezing or rales.  Musculoskeletal:        General: No deformity. Normal range of motion.     Cervical back: Normal range of motion.     Right lower leg: Edema present.     Left lower leg: Edema present.  Skin:    Findings: No rash.  Neurological:     Mental Status: She is alert.     ED Results / Procedures / Treatments   Labs (all labs ordered are listed, but only abnormal results are displayed) Labs Reviewed  BASIC METABOLIC PANEL WITH GFR - Abnormal; Notable for the following components:      Result Value   Potassium 3.4 (*)    CO2 20 (*)    Glucose, Bld 104 (*)    All other components within normal limits  TROPONIN I (HIGH SENSITIVITY) - Abnormal; Notable for the following components:  Troponin I (High Sensitivity) 24 (*)    All other components within normal limits  CBC WITH DIFFERENTIAL/PLATELET  TROPONIN I (HIGH SENSITIVITY)    EKG EKG Interpretation Date/Time:  Sunday Nov 28 2023 12:18:31 EDT Ventricular Rate:  117 PR Interval:  176 QRS Duration:  164 QT Interval:  364 QTC Calculation: 458 R Axis:   -13  Text Interpretation: Atrial flutter with variable block Multiform ventricular premature complexes Nonspecific intraventricular conduction delay Nonspecific ST-T changes Confirmed by Abner Hoffman [phone]) on 11/28/2023 12:38:45 PM  Radiology DG Chest Portable 1 View Result Date: 11/28/2023 CLINICAL DATA:  Syncope EXAM: PORTABLE CHEST 1 VIEW COMPARISON:  Chest radiograph dated 01/11/2011 FINDINGS: Normal lung volumes. Multifocal patchy left lung opacities. No pleural effusion or pneumothorax. The heart size  and mediastinal contours are within normal limits. No acute osseous abnormality. IMPRESSION: Multifocal patchy left lung opacities, which may represent multifocal aspiration or pneumonia. Electronically Signed   By: Limin  Xu M.D.   On: 11/28/2023 13:49    Procedures .Critical Care  Performed by: Lucina Sabal, PA-C Authorized by: Lucina Sabal, PA-C   Critical care provider statement:    Critical care time (minutes):  30   Critical care was necessary to treat or prevent imminent or life-threatening deterioration of the following conditions: Atrial flutter, soft blood pressures.   Critical care was time spent personally by me on the following activities:  Development of treatment plan with patient or surrogate, discussions with consultants, evaluation of patient's response to treatment, examination of patient, ordering and review of laboratory studies, ordering and review of radiographic studies, ordering and performing treatments and interventions, pulse oximetry, re-evaluation of patient's condition and review of old charts   Care discussed with: admitting provider       Medications Ordered in ED Medications  apixaban (ELIQUIS) tablet 5 mg (has no administration in time range)  lactated ringers bolus 1,000 mL (1,000 mLs Intravenous New Bag/Given 11/28/23 1253)    ED Course/ Medical Decision Making/ A&P          CHA2DS2-VASc Score: 2                        Medical Decision Making Amount and/or Complexity of Data Reviewed Labs: ordered. Radiology: ordered.  Risk Prescription drug management.   Medical Decision Making / ED Course   This patient presents to the ED for concern of palpitations, this involves an extensive number of treatment options, and is a complaint that carries with it a high risk of complications and morbidity.  The differential diagnosis includes A-fib, a flutter, PVCs  MDM: 68 year old female presents with above-mentioned complaint.  Overall well-appearing.   Soft blood pressures on arrival.  Will give IV fluids.  Has not been anticoagulated and states that she has been told she has "flutter" for about a year.  Current episode started this morning.  Admission considered but will reevaluate after labs and imaging.  Will hold off on AV nodal blocking agents now due to soft blood pressure.  Discussed with cardiology.  They will see and agree with current management.  Seen by cardiology.  See their note for full recommendations.  Recommend anticoagulation but recommend holding AV nodal blocking agents at this time.  DVT studies positive for acute left lower extremity DVT.  Will increase the Eliquis dose.  Will add on PE study.  Negative for PE but positive for pneumonia.  Given pneumonia, episode of a flutter with hypotension offered admission which patient  is agreeable to.  Discussed with internal medicine teaching service.  They will evaluate patient for admission.  Later notified that this is an unassigned patient.  Discussed with hospitalist will evaluate patient for admission.    Additional history obtained: -Additional history obtained from husband -External records from outside source obtained and reviewed including: Chart review including previous notes, labs, imaging, consultation notes   Lab Tests: -I ordered, reviewed, and interpreted labs.   The pertinent results include:   Labs Reviewed  BASIC METABOLIC PANEL WITH GFR - Abnormal; Notable for the following components:      Result Value   Potassium 3.4 (*)    CO2 20 (*)    Glucose, Bld 104 (*)    All other components within normal limits  TROPONIN I (HIGH SENSITIVITY) - Abnormal; Notable for the following components:   Troponin I (High Sensitivity) 24 (*)    All other components within normal limits  CBC WITH DIFFERENTIAL/PLATELET  TROPONIN I (HIGH SENSITIVITY)      EKG  EKG Interpretation Date/Time:  Sunday Nov 28 2023 12:18:31 EDT Ventricular Rate:  117 PR  Interval:  176 QRS Duration:  164 QT Interval:  364 QTC Calculation: 458 R Axis:   -13  Text Interpretation: Atrial flutter with variable block Multiform ventricular premature complexes Nonspecific intraventricular conduction delay Nonspecific ST-T changes Confirmed by Abner Hoffman [phone]) on 11/28/2023 12:38:45 PM         Imaging Studies ordered: I ordered imaging studies including chest x-ray, CT angio chest PE study, DVT study I independently visualized and interpreted imaging. I agree with the radiologist interpretation   Medicines ordered and prescription drug management: Meds ordered this encounter  Medications   lactated ringers bolus 1,000 mL   apixaban (ELIQUIS) tablet 5 mg    -I have reviewed the patients home medicines and have made adjustments as needed  Critical interventions Fluids, cardiology consult, anticoagulation  Consultations Obtained: I requested consultation with the cardiology,  and discussed lab and imaging findings as well as pertinent plan - they recommend: As above   Cardiac Monitoring: The patient was maintained on a cardiac monitor.  I personally viewed and interpreted the cardiac monitored which showed an underlying rhythm of: Atrial flutter with RVR of rates between 110-120  Social Determinants of Health:  Factors impacting patients care include: Poor medicine compliance   Reevaluation: After the interventions noted above, I reevaluated the patient and found that they have :improved  Co morbidities that complicate the patient evaluation History reviewed. No pertinent past medical history.    Dispostion: Discussed with hospitalist.  He he will evaluate patient for admission.   Final Clinical Impression(s) / ED Diagnoses Final diagnoses:  Near syncope  Pneumonia due to infectious organism, unspecified laterality, unspecified part of lung  Atrial flutter, unspecified type (HCC)  Acute deep vein thrombosis (DVT) of proximal vein of  left lower extremity [facility])    Rx / DC Orders ED Discharge Orders     None         Lucina Sabal, PA-C 11/28/23 2019    Lucina Sabal, PA-C 11/28/23 2020    Lucina Sabal, PA-C 11/28/23 2051    Carin Charleston, MD 11/28/23 2057

## 2023-11-28 NOTE — Progress Notes (Signed)
 VASCULAR LAB    Left lower extremity venous duplex has been performed.  See CV proc for preliminary results.  Relayed results to Lucina Sabal, PA-C and Abner Hoffman, MD via secure chat  Carleene Chase, RVT 11/28/2023, 3:51 PM

## 2023-11-28 NOTE — Hospital Course (Signed)
 Maybe on lasix

## 2023-11-28 NOTE — ED Triage Notes (Signed)
 PT BIB EMS from church, woke up dizzy this morning, still went to church, started feeling worse had to lay down at church.  Pt was supposed to start Eliqus on Friday, but doesn't remember what for.   118/60 HR 134-270 20 gauge R wrist 500 NS

## 2023-11-28 NOTE — H&P (Signed)
 History and Physical    [name] KGM:[id number] DOB: 12/02/1955 DOA: 11/28/2023  PCP: Patient, No Pcp Per  Patient coming from: Home  I have personally briefly reviewed patient's old medical records in [facility]  Chief Complaint: Lightheadedness  HPI: [name] is a 68 y.o. female with medical history significant for paroxysmal atrial flutter who presented to the ED for evaluation after a near syncopal episode.  Patient states that she was at church singing in the choir when she began to feel lightheaded.  She sat down for a while and then when she stood up she felt lightheaded again and felt like she was going to faint.  She did not lose consciousness or fall.  She has been having some feeling like her chest is pounding.  She did not have any nausea, vomiting, or diarrhea prior to arrival but reports feeling nauseous at time of admission.  She has not had any abdominal pain.  She denies dyspnea.  She did have new cough while she was sleeping last night per her husband.  She has noticed some swelling around her ankles.  She denies any pain in her lower extremities.  ED Course  Labs/Imaging on admission: I have personally reviewed following labs and imaging studies.  Initial vitals showed BP 86/66, pulse 113, RR 19, temp 99.0 F, SpO2 97% on room air.  Labs show WBC 4.3, hemoglobin 12.2, platelets 362, sodium 141, potassium 3.4, bicarb 20, BUN 21, creatinine 0.88, serum glucose 104, WBC 4.3, hemoglobin 12.2, platelets 362, troponin 24 > 69.  CTA chest negative for PE.  Patchy groundglass and nodular opacities in the left upper and lower lobe seen suspicious for pneumonia.  Prominent left hilar and prevascular nodes likely reactive.  Lower extremity venous ultrasound shows acute DVT involving proximal segments of left posterior tibial and left peroneal veins.  Patient was given 1 L LR, IV ceftriaxone and azithromycin.  Cardiology were consulted and are  following.  Patient was started on Eliquis.  The hospitalist service was consulted to admit.  Review of Systems: All systems reviewed and are negative except as documented in history of present illness above.   History reviewed. No pertinent past medical history.  Past Surgical History:  Procedure Laterality Date   ABDOMINAL HYSTERECTOMY     CHOLECYSTECTOMY      Social History: Social History   Tobacco Use   Smoking status: Former    Current packs/day: 0.00    Types: Cigarettes    Quit date: 2013    Years since quitting: 12.3   Smokeless tobacco: Never  Vaping Use   Vaping status: Never Used  Substance Use Topics   Alcohol use: Not Currently   Drug use: Never   Allergies  Allergen Reactions   Peanut (Diagnostic) Hives   Coconut (Cocos Nucifera) Anxiety and Swelling    Family History  Problem Relation Age of Onset   Hypertension Mother    Diabetes Father    Diabetes Sister    Diabetes Brother    Cancer Paternal Grandfather    Hypertension Brother      Prior to Admission medications   Medication Sig Start Date End Date Taking? Authorizing Provider  cholecalciferol (VITAMIN D3) 25 MCG (1000 UNIT) tablet Take 1,000 Units by mouth daily.    [provider]  saw palmetto 160 MG capsule Take 160 mg by mouth 2 (two) times daily.    [provider]    Physical Exam: Vitals:   11/28/23 1630  11/28/23 1730 11/28/23 2000 11/28/23 2100  BP:  122/87 125/83 125/78  Pulse:  80 63 (!) 56  Resp:  15 10 19   Temp: 98.5 F (36.9 C)  98.4 F (36.9 C)   TempSrc: Oral  Oral   SpO2:  99% 100% 97%  Weight:      Height:       Constitutional: Resting in bed, NAD, she reports feeling nauseous Eyes: EOMI, lids and conjunctivae normal ENMT: Mucous membranes are moist. Posterior pharynx clear of any exudate or lesions.Normal dentition.  Neck: normal, supple, no masses. Respiratory: clear to auscultation bilaterally, no wheezing, no crackles. Normal respiratory  effort. No accessory muscle use.  Cardiovascular: Irregular, no murmurs / rubs / gallops. No extremity edema. 2+ pedal pulses. Abdomen: no tenderness, no masses palpated. Musculoskeletal: no clubbing / cyanosis. No joint deformity upper and lower extremities. Good ROM, no contractures. Normal muscle tone.  Skin: no rashes, lesions, ulcers. No induration Neurologic: Sensation intact. Strength 5/5 in all 4.  Psychiatric: Normal judgment and insight. Alert and oriented x 3. Normal mood.   EKG: Personally reviewed.  Atrial flutter with variable block, rate 117.  No prior for comparison.  Assessment/Plan Principal Problem:   Paroxysmal atrial flutter (HCC) Active Problems:   Near syncope   Community acquired pneumonia of left upper lobe of lung   Acute deep vein thrombosis (DVT) of tibial vein of left lower extremity (HCC)   Hypokalemia   [name] is a 68 y.o. female with medical history significant for paroxysmal atrial flutter who is admitted after near syncopal episode in setting of atrial flutter and hypotension.  Assessment and Plan: Near syncope: Near syncopal episode could be from hypotension or potentially related to atrial flutter.  She did not lose consciousness. - Check orthostatic vitals - IV fluid hydration overnight - Keep on telemetry - PT/OT eval, fall precautions - Obtain echocardiogram  Paroxysmal atrial flutter: Remains in atrial fibrillation with rate maintaining 100-110.  Hypotensive on initial arrival.  Seen by cardiology, holding off on AV nodal agents at this time. - Started on Eliquis - Echocardiogram  Left upper and lower lobe lung opacities: Seen on CTA chest.  Potentially developing pneumonia including viral process versus noninfectious etiology.  Question of aspiration event at night per history. - Continue ceftriaxone and doxycycline for now - Check COVID/influenza/RSV panel - Supplemental oxygen as needed  Acute left lower extremity  DVT: Venous ultrasound shows subacute DVT involving proximal segments of left posterior tibial and peroneal veins.  She has been started on Eliquis.  Hypokalemia: Supplementing.   DVT prophylaxis:  apixaban (ELIQUIS) tablet 10 mg  apixaban (ELIQUIS) tablet 5 mg   Code Status: Full code Family Communication: Husband at bedside Disposition Plan: From home and likely discharge to home pending clinical progress Consults called: Cardiology Severity of Illness: The appropriate patient status for this patient is OBSERVATION. Observation status is judged to be reasonable and necessary in order to provide the required intensity of service to ensure the patient's safety. The patient's presenting symptoms, physical exam findings, and initial radiographic and laboratory data in the context of their medical condition is felt to place them at decreased risk for further clinical deterioration. Furthermore, it is anticipated that the patient will be medically stable for discharge from the hospital within 2 midnights of admission.   Edith Gores MD [facility]  If 7PM-7AM, please contact night-coverage [email or web address]  11/28/2023, 10:42 PM

## 2023-11-28 NOTE — Hospital Course (Signed)
 [name] is a 68 y.o. female with medical history significant for paroxysmal atrial flutter who is admitted after near syncopal episode in setting of atrial flutter and hypotension.
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
## 2023-11-28 NOTE — ED Notes (Signed)
 Per lab Troponin 69, [place], [place] notified.

## 2023-11-29 ENCOUNTER — Telehealth (HOSPITAL_COMMUNITY): Payer: Self-pay | Admitting: Pharmacy Technician

## 2023-11-29 ENCOUNTER — Other Ambulatory Visit (HOSPITAL_COMMUNITY): Payer: Self-pay

## 2023-11-29 ENCOUNTER — Observation Stay (HOSPITAL_BASED_OUTPATIENT_CLINIC_OR_DEPARTMENT_OTHER)

## 2023-11-29 DIAGNOSIS — I4892 Unspecified atrial flutter: Secondary | ICD-10-CM

## 2023-11-29 DIAGNOSIS — R55 Syncope and collapse: Secondary | ICD-10-CM | POA: Diagnosis not present

## 2023-11-29 DIAGNOSIS — J189 Pneumonia, unspecified organism: Secondary | ICD-10-CM | POA: Diagnosis not present

## 2023-11-29 LAB — BASIC METABOLIC PANEL WITH GFR
Anion gap: 9 (ref 5–15)
BUN: 8 mg/dL (ref 8–23)
CO2: 22 mmol/L (ref 22–32)
Calcium: 8.8 mg/dL — ABNORMAL LOW (ref 8.9–10.3)
Chloride: 109 mmol/L (ref 98–111)
Creatinine, Ser: 0.54 mg/dL (ref 0.44–1.00)
GFR, Estimated: >60 mL/min (ref >60)
Glucose, Bld: 87 mg/dL (ref 70–99)
Potassium: 4 mmol/L (ref 3.5–5.1)
Sodium: 140 mmol/L (ref 135–145)

## 2023-11-29 LAB — CBC
HCT: 32.1 % — ABNORMAL LOW (ref 36.0–46.0)
Hemoglobin: 10.7 g/dL — ABNORMAL LOW (ref 12.0–15.0)
MCH: 27.4 pg (ref 26.0–34.0)
MCHC: 33.3 g/dL (ref 30.0–36.0)
MCV: 82.3 fL (ref 80.0–100.0)
Platelets: 284 10*3/uL (ref 150–400)
RBC: 3.9 MIL/uL (ref 3.87–5.11)
RDW: 15.4 % (ref 11.5–15.5)
WBC: 4.3 10*3/uL (ref 4.0–10.5)
nRBC: 0 % (ref 0.0–0.2)

## 2023-11-29 LAB — ECHOCARDIOGRAM COMPLETE
AR max vel: 2.43 cm2
AV Area VTI: 2.96 cm2
AV Area mean vel: 2.42 cm2
AV Mean grad: 3 mmHg
AV Peak grad: 4.9 mmHg
Ao pk vel: 1.11 m/s
Area-P 1/2: 4.01 cm2
Calc EF: 60.8 %
Height: 64 in
MV VTI: 1.8 cm2
S' Lateral: 3.3 cm
Single Plane A2C EF: 64.4 %
Single Plane A4C EF: 56.9 %
Weight: 3209.9 [oz_av]

## 2023-11-29 LAB — HIV ANTIBODY (ROUTINE TESTING W REFLEX): HIV Screen 4th Generation wRfx: NONREACTIVE

## 2023-11-29 MED ORDER — METOPROLOL TARTRATE 12.5 MG HALF TABLET
12.5000 mg | ORAL_TABLET | Freq: Two times a day (BID) | ORAL | Status: DC
Start: 2023-11-29 — End: 2023-12-02
  Administered 2023-11-29 – 2023-12-02 (×6): 12.5 mg via ORAL
  Filled 2023-11-29 (×7): qty 1

## 2023-11-29 MED ORDER — SODIUM CHLORIDE 0.9 % IV SOLN
1.5000 g | Freq: Four times a day (QID) | INTRAVENOUS | Status: DC
  Administered 2023-11-29 – 2023-12-02 (×13): 1.5 g via INTRAVENOUS
  Filled 2023-11-29 (×18): qty 4

## 2023-11-29 NOTE — Progress Notes (Addendum)
 Rounding Note    Patient Name: [name] Date of Encounter: 11/29/2023  [facility] Cardiologist: None   Subjective   Feeling well.  Denies palpitations or shortness of breath.  Occasional lightheadedness at home but not significant.  Inpatient Medications    Scheduled Meds:  apixaban  10 mg Oral BID   Followed by   Cecily Cohen ON 12/05/2023] apixaban  5 mg Oral BID   doxycycline  100 mg Oral Q12H   sodium chloride flush  3 mL Intravenous Q12H   Continuous Infusions:  ampicillin-sulbactam (UNASYN) IV 1.5 g (11/29/23 0818)   PRN Meds: acetaminophen **OR** acetaminophen, bisacodyl, metoprolol tartrate, ondansetron (ZOFRAN) IV, senna-docusate   Vital Signs    Vitals:   11/29/23 0300 11/29/23 0500 11/29/23 0600 11/29/23 0815  BP: 103/85 120/75 110/84 111/68  Pulse: (!) 53 (!) 58 (!) 54 92  Resp: 19 18 15 18   Temp: 98.2 F (36.8 C)  98.4 F (36.9 C)   TempSrc: Oral  Oral   SpO2: 97% 97% 98% 96%  Weight:      Height:        Intake/Output Summary (Last 24 hours) at 11/29/2023 0850 Last data filed at 11/28/2023 2216 Gross per 24 hour  Intake 350 ml  Output --  Net 350 ml      11/28/2023   12:11 PM 02/18/2023    1:34 PM 01/01/2022   10:51 AM  Last 3 Weights  Weight (lbs) 200 lb 9.9 oz 200 lb 185 lb 1.6 oz  Weight (kg) 91 kg 90.719 kg 83.961 kg      Telemetry    Atrial flutter.  Rates 90s to 110s.- Personally Reviewed  ECG    Typical atrial flutter.  Variable block.  Rate 117 bpm. - Personally Reviewed  Physical Exam   VS:  BP 111/68   Pulse 92   Temp 98.4 F (36.9 C) (Oral)   Resp 18   Ht 5\' 4"  (1.626 m)   Wt 91 kg   SpO2 96%   BMI 34.44 kg/m  , BMI Body mass index is 34.44 kg/m. GENERAL:  Well appearing HEENT: Pupils equal round and reactive, fundi not visualized, oral mucosa unremarkable NECK:  No jugular venous distention, waveform within normal limits, carotid upstroke brisk and symmetric, no bruits, no thyromegaly  LUNGS:   Clear to auscultation bilaterally HEART: Tachycardic.  Irregularly irregular.Aaron Aas  PMI not displaced or sustained,S1 and S2 within normal limits, no S3, no S4, no clicks, no rubs, no murmurs ABD:  Flat, positive bowel sounds normal in frequency in pitch, no bruits, no rebound, no guarding, no midline pulsatile mass, no hepatomegaly, no splenomegaly EXT:  2 plus pulses throughout, no edema, no cyanosis no clubbing SKIN:  No rashes no nodules NEURO:  Cranial nerves II through XII grossly intact, motor grossly intact throughout PSYCH:  Cognitively intact, oriented to person place and time    Labs    High Sensitivity Troponin:   Recent Labs  Lab 11/28/23 1211 11/28/23 1645 11/28/23 2214  TROPONINIHS 24* 69* 42*     Chemistry Recent Labs  Lab 11/28/23 1211 11/28/23 2214 11/29/23 0508  NA 141  --  140  K 3.4*  --  4.0  CL 107  --  109  CO2 20*  --  22  GLUCOSE 104*  --  87  BUN 21  --  8  CREATININE 0.88  --  0.54  CALCIUM 9.2  --  8.8*  MG  --  1.7  --   GFRNONAA >60  --  >60  ANIONGAP 14  --  9    Lipids No results for input(s): "CHOL", "TRIG", "HDL", "LABVLDL", "LDLCALC", "CHOLHDL" in the last 168 hours.  Hematology Recent Labs  Lab 11/28/23 1211 11/29/23 0508  WBC 4.3 4.3  RBC 4.47 3.90  HGB 12.2 10.7*  HCT 38.0 32.1*  MCV 85.0 82.3  MCH 27.3 27.4  MCHC 32.1 33.3  RDW 15.4 15.4  PLT 362 284   Thyroid No results for input(s): "TSH", "FREET4" in the last 168 hours.  BNPNo results for input(s): "BNP", "PROBNP" in the last 168 hours.  DDimer No results for input(s): "DDIMER" in the last 168 hours.   Radiology    CT Angio Chest PE W and/or Wo Contrast Result Date: 11/28/2023 CLINICAL DATA:  Pulmonary embolism (PE) suspected, high prob Syncope. EXAM: CT ANGIOGRAPHY CHEST WITH CONTRAST TECHNIQUE: Multidetector CT imaging of the chest was performed using the standard protocol during bolus administration of intravenous contrast. Multiplanar CT image reconstructions and  MIPs were obtained to evaluate the vascular anatomy. RADIATION DOSE REDUCTION: This exam was performed according to the departmental dose-optimization program which includes automated exposure control, adjustment of the mA and/or kV according to patient size and/or use of iterative reconstruction technique. CONTRAST:  75mL OMNIPAQUE IOHEXOL 350 MG/ML SOLN COMPARISON:  Radiograph earlier today FINDINGS: Cardiovascular: There are no filling defects within the pulmonary arteries to suggest pulmonary embolus. The heart is normal in size. No pericardial effusion. Trace atherosclerosis of the thoracic aorta. No aneurysm. Mediastinum/Nodes: Prominent left hilar nodes measuring up to 10 mm, likely reactive. There also prominent prevascular nodes measuring up to 8 mm short axis. Calcified left thyroid nodule measuring 11 mm in the left lobe. Not clinically significant; no follow-up imaging recommended (ref: J Am Coll Radiol. 2015 Feb;12(2): 143-50). Unremarkable esophagus. Lungs/Pleura: Patchy ground-glass CP and nodular opacities in the left upper and to a lesser extent lower lobe typical of pneumonia. There is also minimal dependent opacity in the right lower lobe. Mild central bronchial thickening. No pleural fluid. Trachea and central airways are clear. Upper Abdomen: No acute findings.  Cholecystectomy. Musculoskeletal: There are no acute or suspicious osseous abnormalities. Thoracic spondylosis. Review of the MIP images confirms the above findings. IMPRESSION: 1. No pulmonary embolus. 2. Patchy ground-glass the and nodular opacities in the left upper and to a lesser extent lower lobe typical of pneumonia. 3. Prominent left hilar and prevascular nodes, likely reactive. Aortic Atherosclerosis (ICD10-I70.0). Electronically Signed   By: Chadwick Colonel M.D.   On: 11/28/2023 19:34   VAS US  LOWER EXTREMITY VENOUS (DVT) (7a-7p) Result Date: 11/28/2023  Lower Venous DVT Study Patient Name:  [name]  Date of  Exam:   11/28/2023 Medical Rec #: [id number]            Accession #:    [id number] Date of Birth: 02/02/1956           Patient Gender: F Patient Age:   68 years Exam Location:  [facility] Procedure:      VAS US  LOWER EXTREMITY VENOUS (DVT) Referring Phys: AMJAD ALI --------------------------------------------------------------------------------  Indications: Swelling, and Dizziness, hypotension. Aflutter with RVR Other Indications: New diagnosis of Aflutter by primary care, prescribed Eliquis                    but delayed starting it. Risk Factors: Aflutter. Comparison Study: No prior study on file Performing Technologist: Carleene Chase RVS  Examination  Guidelines: A complete evaluation includes B-mode imaging, spectral Doppler, color Doppler, and power Doppler as needed of all accessible portions of each vessel. Bilateral testing is considered an integral part of a complete examination. Limited examinations for reoccurring indications may be performed as noted. The reflux portion of the exam is performed with the patient in reverse Trendelenburg.  +-----+---------------+---------+-----------+----------+--------------+ RIGHTCompressibilityPhasicitySpontaneityPropertiesThrombus Aging +-----+---------------+---------+-----------+----------+--------------+ CFV  Full           Yes      Yes                                 +-----+---------------+---------+-----------+----------+--------------+   +---------+---------------+---------+-----------+----------+--------------+ LEFT     CompressibilityPhasicitySpontaneityPropertiesThrombus Aging +---------+---------------+---------+-----------+----------+--------------+ CFV      Full           Yes      Yes                                 +---------+---------------+---------+-----------+----------+--------------+ SFJ      Full                                                         +---------+---------------+---------+-----------+----------+--------------+ FV Prox  Full                                                        +---------+---------------+---------+-----------+----------+--------------+ FV Mid   Full                                                        +---------+---------------+---------+-----------+----------+--------------+ FV DistalFull                                                        +---------+---------------+---------+-----------+----------+--------------+ PFV      Full                                                        +---------+---------------+---------+-----------+----------+--------------+ POP      Full           Yes      Yes                                 +---------+---------------+---------+-----------+----------+--------------+ PTV      Partial                                      Acute          +---------+---------------+---------+-----------+----------+--------------+  PERO     Partial                                      Acute          +---------+---------------+---------+-----------+----------+--------------+    Summary: RIGHT: - No evidence of common femoral vein obstruction.   LEFT: - Findings consistent with acute deep vein thrombosis involving the proximal segments of the left posterior tibial veins, and left peroneal veins.   *See table(s) above for measurements and observations.    Preliminary    DG Chest Portable 1 View Result Date: 11/28/2023 CLINICAL DATA:  Syncope EXAM: PORTABLE CHEST 1 VIEW COMPARISON:  Chest radiograph dated 01/11/2011 FINDINGS: Normal lung volumes. Multifocal patchy left lung opacities. No pleural effusion or pneumothorax. The heart size and mediastinal contours are within normal limits. No acute osseous abnormality. IMPRESSION: Multifocal patchy left lung opacities, which may represent multifocal aspiration or pneumonia. Electronically Signed   By: Limin  Xu M.D.   On:  11/28/2023 13:49    Cardiac Studies   Echo pending  Patient Profile     68 y.o. female with atrial flutter here with recurrent atrial flutter.   Assessment & Plan    # Atrial flutter:  Patient recently diagnosed with atrial flutter.  Now on Eliquis.  BP low so she is not on any nodal agnts.   We will try to add metoprolol to tartrate 12.5 mg twice daily.  Echo pending.  If heart function is reduced we will plan to get TEE/DCCV this admission.  If her systolic function is preserved and we can rate control her, plan for outpatient DCCV after 3 weeks of anticoagulation.  She does snore and husband reports episodes of apnea overnight.  Recommend outpatient sleep study.  Will need to discuss exercise and weight loss recommendations.  This patients CHA2DS2-VASc Score and unadjusted Ischemic Stroke Rate (% per year) is equal to 2.2 % stroke rate/year from a score of 2  Above score calculated as 1 point each if present [CHF, HTN, DM, Vascular=MI/PAD/Aortic Plaque, Age if 65-74, or Female] Above score calculated as 2 points each if present [Age > 75, or Stroke/TIA/TE]  # Elevated troponin: HS-troponin mildly elevated to 24-->69-->42.  She has no ischemic symptoms.  CT images personally reviewed.  She does not have any coronary calcifications.  I suspect this is more related to demand ischemia.  No plans for further ischemic evaluation at this time.  # Atypical PNA:  Viral vs aspiriation.  COVID, flu and RSV negative.  She was started on Ceftriaxone and azithromycin now transitioned to doxycycline.   # Acute L LE DVT:  New this admission.  Now on Eliquis.  No PE on CT.    For questions or updates, please contact [facility] Please consult [email or web address] for contact info under        Signed, Maudine Sos, MD  11/29/2023, 8:50 AM

## 2023-11-29 NOTE — Progress Notes (Signed)
  Echocardiogram 2D Echocardiogram has been performed.  Amauri Keefe L Itzamara Casas RDCS 11/29/2023, 3:34 PM

## 2023-11-29 NOTE — Progress Notes (Signed)
 PT Cancellation Note  Patient Details Name: [name] MRN: [id number] DOB: 12/21/1955   Cancelled Treatment:    Reason Eval/Treat Not Completed: PT screened, no needs identified, will sign off. Pt reports mobilizing independently and denies any significant symptoms at this time, OT also reports independence. Pt declines the need for PT evaluation at this time. Pt is aware to request re-consult if mobility concerns arise. PT signing off.   Rexie Catena 11/29/2023, 12:16 PM

## 2023-11-29 NOTE — Evaluation (Signed)
 Occupational Therapy Evaluation Patient Details Name: [name] MRN: [id number] DOB: 04-Jan-1956 Today's Date: 11/29/2023   History of Present Illness   68 y.o. female presents to [facility] hospital on 11/28/2023 after a near syncopal episode at [place]. CT chest shows groundglass opacities in LLL suspicious for PNA and hilar lymphadenopathy. Doppler with finding of LLE DVT. PMH includes PAF.     Clinical Impressions Patient admitted for the diagnosis above.  Patient is presenting at her baseline with no deficits with respect to mobility or ADL completion.  Patient has no dizziness, and consistent BP with positional changes: supine 101/63, sit 108/70, standing 118/65.  No OT needs identified.  Advised PT they could scree the patient as she is walking to the bathroom independently as needed.  Patient able to walk the ED halls with OT without incident.       If plan is discharge home, recommend the following:   Other (comment)     Functional Status Assessment   Patient has not had a recent decline in their functional status     Equipment Recommendations   None recommended by OT     Recommendations for Other Services         Precautions/Restrictions   Precautions Precautions: None Restrictions Weight Bearing Restrictions Per Provider Order: No     Mobility Bed Mobility Overal bed mobility: Independent                  Transfers Overall transfer level: Independent                        Balance Overall balance assessment: No apparent balance deficits (not formally assessed)                                         ADL either performed or assessed with clinical judgement   ADL Overall ADL's : Independent                                             Vision Patient Visual Report: No change from baseline       Perception Perception: Not tested       Praxis Praxis: Not tested       Pertinent  Vitals/Pain Pain Assessment Pain Assessment: No/denies pain     Extremity/Trunk Assessment Upper Extremity Assessment Upper Extremity Assessment: Overall WFL for tasks assessed   Lower Extremity Assessment Lower Extremity Assessment: Overall WFL for tasks assessed   Cervical / Trunk Assessment Cervical / Trunk Assessment: Normal   Communication Communication Communication: No apparent difficulties   Cognition Arousal: Alert Behavior During Therapy: WFL for tasks assessed/performed Cognition: No apparent impairments                               Following commands: Intact       Cueing  General Comments       VSS on RA   Exercises     Shoulder Instructions      Home Living Family/patient expects to be discharged to:: Private residence Living Arrangements: Spouse/significant other Available Help at Discharge: Family;Available 24 hours/day Type of Home: House Home Access: Stairs to enter [employer] of Steps: 6 Entrance Stairs-Rails:  Right;Left;Can reach both Home Layout: Two level;Bed/bath upstairs   Alternate Level Stairs-Rails: Left Bathroom Shower/Tub: Tub/shower unit;Walk-in shower   Bathroom Toilet: Standard Bathroom Accessibility: Yes How Accessible: Accessible via walker Home Equipment: Agricultural consultant (2 wheels)   Additional Comments: Spouse has RW post remote heart attack      Prior Functioning/Environment Prior Level of Function : Independent/Modified Independent;Driving                    OT Problem List: Other (comment)   OT Treatment/Interventions:        OT Goals(Current goals can be found in the care plan section)   Acute Rehab OT Goals Patient Stated Goal: Go home today OT Goal Formulation: With patient Time For Goal Achievement: 12/03/23 Potential to Achieve Goals: Good   OT Frequency:       Co-evaluation              AM-PAC OT "6 Clicks" Daily Activity     Outcome Measure Help from  another person eating meals?: None Help from another person taking care of personal grooming?: None Help from another person toileting, which includes using toliet, bedpan, or urinal?: None Help from another person bathing (including washing, rinsing, drying)?: None Help from another person to put on and taking off regular upper body clothing?: None Help from another person to put on and taking off regular lower body clothing?: None 6 Click Score: 24   End of Session Nurse Communication: Mobility status  Activity Tolerance: Patient tolerated treatment well Patient left: in bed;with call bell/phone within reach;with nursing/sitter in room  OT Visit Diagnosis: Unsteadiness on feet (R26.81)                Time: 9629-5284 OT Time Calculation (min): 21 min Charges:  OT General Charges $OT Visit: 1 Visit OT Evaluation $OT Eval Moderate Complexity: 1 Mod  11/29/2023  RP, OTR/L  Acute Rehabilitation Services  Office:  [phone]   Benjamen Brand 11/29/2023, 10:38 AM

## 2023-11-29 NOTE — ED Notes (Signed)
 Collect labs at 5am...KM

## 2023-11-29 NOTE — ED Notes (Signed)
Assumed care of pt from Rebeckah, RN

## 2023-11-29 NOTE — Telephone Encounter (Signed)
 Patient Product/process development scientist completed.    The patient is insured through [employer]. Patient has Medicare and is not eligible for a copay card, but may be able to apply for patient assistance or Medicare RX Payment Plan (Patient Must reach out to their plan, if eligible for payment plan), if available.    Ran test claim for Eliquis 5 mg and the current 30 day co-pay is $297.00 due to a $250.00 deductible.  Will be $47.00 once deductible is met.   This test claim was processed through [facility]- copay amounts may vary at other pharmacies due to pharmacy/plan contracts, or as the patient moves through the different stages of their insurance plan.     Roland Earl, CPHT Pharmacy Technician III Certified Patient Advocate [facility] Patient Advocate Team Direct Number: [phone]  Fax: [phone]

## 2023-11-29 NOTE — Care Management Obs Status (Signed)
 MEDICARE OBSERVATION STATUS NOTIFICATION   Patient Details  Name: [name] MRN: [id number] Date of Birth: [birth year]   Medicare Observation Status Notification Given:  Yes    Cosimo Diones, RN 11/29/2023, 5:16 PM

## 2023-11-29 NOTE — Progress Notes (Signed)
 [facility] PROGRESS NOTE    Progress Note  [name]  UEA:[id number] DOB: Nov 28, 1955 DOA: 11/28/2023 PCP: Patient, No Pcp Per     Brief Narrative:   [name] is an 68 y.o. female past medical history of paroxysmal atrial flutter comes into the ED after near syncopal episode she was at church when she began feeling lightheaded did not lose consciousness, CTA of the chest was negative for PE it did show patchy groundglass opacities in the left and lower lobe segment suspicion for pneumonia and hilar lymphadenopathy.  Assessment/Plan:   Near syncope: Started on IV fluids overnight. Likely multifactorial in the setting of atrial flutter and community-acquired pneumonia. 2D echo is pending.  Paroxysmal atrial flutter/A-flutter: Was hypotension and initially seen by cardiology Cardiology was consulted who recommended to start anticoagulation and cardioversion after 30 days of anticoagulation to assess of rhythm control improves. Heart rate around 110 bpm  Left upper lobe opacity question viral pneumonia versus aspiration pneumonia: Seen on imaging. Viral process versus noninfectious etiology. Possible aspiration event as she woke up during the night coughing. Started on Rocephin and azithromycin. Continue oxygen. SARS-CoV-2/influenza and RSV panel negative.  Hypotension: Likely due to dehydration and possibly underlying pneumonia  Acute deep vein thrombosis (DVT) of tibial vein of left lower extremity (HCC) Lower extremity Doppler showed DVT on the left posterior tibial and peroneal vein. Continue Eliquis.  Hypokalemia Repleted orally now improved.  Normocytic anemia: Continue monitoring signs of her bleeding.   DVT prophylaxis: Eliquis Family Communication:none Status is: Observation The patient remains OBS appropriate and will d/c before 2 midnights.    Code Status:     Code Status Orders  (From admission, onward)            Start     Ordered   11/28/23 2217  Full code  Continuous       Question:  By:  Answer:  Consent: discussion documented in EHR   11/28/23 2218           Code Status History     This patient has a current code status but no historical code status.      Advance Directive Documentation    Flowsheet Row Most Recent Value  Type of Advance Directive Living will  Pre-existing out of facility DNR order (yellow form or pink MOST form) --  "MOST" Form in Place? --         IV Access:   Peripheral IV   Procedures and diagnostic studies:   CT Angio Chest PE W and/or Wo Contrast Result Date: 11/28/2023 CLINICAL DATA:  Pulmonary embolism (PE) suspected, high prob Syncope. EXAM: CT ANGIOGRAPHY CHEST WITH CONTRAST TECHNIQUE: Multidetector CT imaging of the chest was performed using the standard protocol during bolus administration of intravenous contrast. Multiplanar CT image reconstructions and MIPs were obtained to evaluate the vascular anatomy. RADIATION DOSE REDUCTION: This exam was performed according to the departmental dose-optimization program which includes automated exposure control, adjustment of the mA and/or kV according to patient size and/or use of iterative reconstruction technique. CONTRAST:  75mL OMNIPAQUE IOHEXOL 350 MG/ML SOLN COMPARISON:  Radiograph earlier today FINDINGS: Cardiovascular: There are no filling defects within the pulmonary arteries to suggest pulmonary embolus. The heart is normal in size. No pericardial effusion. Trace atherosclerosis of the thoracic aorta. No aneurysm. Mediastinum/Nodes: Prominent left hilar nodes measuring up to 10 mm, likely reactive. There also prominent prevascular nodes measuring up to 8 mm short axis. Calcified left thyroid nodule measuring 11  mm in the left lobe. Not clinically significant; no follow-up imaging recommended (ref: J Am Coll Radiol. 2015 Feb;12(2): 143-50). Unremarkable esophagus. Lungs/Pleura: Patchy ground-glass CP and  nodular opacities in the left upper and to a lesser extent lower lobe typical of pneumonia. There is also minimal dependent opacity in the right lower lobe. Mild central bronchial thickening. No pleural fluid. Trachea and central airways are clear. Upper Abdomen: No acute findings.  Cholecystectomy. Musculoskeletal: There are no acute or suspicious osseous abnormalities. Thoracic spondylosis. Review of the MIP images confirms the above findings. IMPRESSION: 1. No pulmonary embolus. 2. Patchy ground-glass the and nodular opacities in the left upper and to a lesser extent lower lobe typical of pneumonia. 3. Prominent left hilar and prevascular nodes, likely reactive. Aortic Atherosclerosis (ICD10-I70.0). Electronically Signed   By: Chadwick Colonel M.D.   On: 11/28/2023 19:34   VAS US  LOWER EXTREMITY VENOUS (DVT) (7a-7p) Result Date: 11/28/2023  Lower Venous DVT Study Patient Name:  [name]  Date of Exam:   11/28/2023 Medical Rec #: [id number]            Accession #:    [id number] Date of Birth: [birth year]           Patient Gender: F Patient Age:   78 years Exam Location:  [facility] Procedure:      VAS US  LOWER EXTREMITY VENOUS (DVT) Referring Phys: AMJAD ALI --------------------------------------------------------------------------------  Indications: Swelling, and Dizziness, hypotension. Aflutter with RVR Other Indications: New diagnosis of Aflutter by primary care, prescribed Eliquis                    but delayed starting it. Risk Factors: Aflutter. Comparison Study: No prior study on file Performing Technologist: Carleene Chase RVS  Examination Guidelines: A complete evaluation includes B-mode imaging, spectral Doppler, color Doppler, and power Doppler as needed of all accessible portions of each vessel. Bilateral testing is considered an integral part of a complete examination. Limited examinations for reoccurring indications may be performed as noted. The reflux portion of the exam is  performed with the patient in reverse Trendelenburg.  +-----+---------------+---------+-----------+----------+--------------+ RIGHTCompressibilityPhasicitySpontaneityPropertiesThrombus Aging +-----+---------------+---------+-----------+----------+--------------+ CFV  Full           Yes      Yes                                 +-----+---------------+---------+-----------+----------+--------------+   +---------+---------------+---------+-----------+----------+--------------+ LEFT     CompressibilityPhasicitySpontaneityPropertiesThrombus Aging +---------+---------------+---------+-----------+----------+--------------+ CFV      Full           Yes      Yes                                 +---------+---------------+---------+-----------+----------+--------------+ SFJ      Full                                                        +---------+---------------+---------+-----------+----------+--------------+ FV Prox  Full                                                        +---------+---------------+---------+-----------+----------+--------------+  FV Mid   Full                                                        +---------+---------------+---------+-----------+----------+--------------+ FV DistalFull                                                        +---------+---------------+---------+-----------+----------+--------------+ PFV      Full                                                        +---------+---------------+---------+-----------+----------+--------------+ POP      Full           Yes      Yes                                 +---------+---------------+---------+-----------+----------+--------------+ PTV      Partial                                      Acute          +---------+---------------+---------+-----------+----------+--------------+ PERO     Partial                                      Acute           +---------+---------------+---------+-----------+----------+--------------+    Summary: RIGHT: - No evidence of common femoral vein obstruction.   LEFT: - Findings consistent with acute deep vein thrombosis involving the proximal segments of the left posterior tibial veins, and left peroneal veins.   *See table(s) above for measurements and observations.    Preliminary    DG Chest Portable 1 View Result Date: 11/28/2023 CLINICAL DATA:  Syncope EXAM: PORTABLE CHEST 1 VIEW COMPARISON:  Chest radiograph dated 01/11/2011 FINDINGS: Normal lung volumes. Multifocal patchy left lung opacities. No pleural effusion or pneumothorax. The heart size and mediastinal contours are within normal limits. No acute osseous abnormality. IMPRESSION: Multifocal patchy left lung opacities, which may represent multifocal aspiration or pneumonia. Electronically Signed   By: Limin  Xu M.D.   On: 11/28/2023 13:49     Medical Consultants:   None.   Subjective:    [name] relates she feels better than yesterday.  Objective:    Vitals:   11/28/23 2324 11/29/23 0000 11/29/23 0300 11/29/23 0500  BP:  109/80 103/85 120/75  Pulse:  (!) 54 (!) 53 (!) 58  Resp:  19 19 18   Temp: 98.4 F (36.9 C)  98.2 F (36.8 C)   TempSrc: Oral  Oral   SpO2:  97% 97% 97%  Weight:      Height:       SpO2: 97 %   Intake/Output Summary (Last 24 hours) at 11/29/2023 1610 Last data filed at 11/28/2023 2216 Gross  per 24 hour  Intake 350 ml  Output --  Net 350 ml   Filed Weights   11/28/23 1211  Weight: 91 kg    Exam: General exam: In no acute distress. Respiratory system: Good air movement and clear to auscultation. Cardiovascular system: S1 & S2 heard, RRR. No JVD.[name]  Gastrointestinal system: Abdomen is nondistended, soft and nontender.  Extremities: No pedal edema. Skin: No rashes, lesions or ulcers Psychiatry: Judgement and insight appear normal. Mood & affect appropriate.    Data Reviewed:     Labs: Basic Metabolic Panel: Recent Labs  Lab 11/28/23 1211 11/28/23 2214 11/29/23 0508  NA 141  --  140  K 3.4*  --  4.0  CL 107  --  109  CO2 20*  --  22  GLUCOSE 104*  --  87  BUN 21  --  8  CREATININE 0.88  --  0.54  CALCIUM 9.2  --  8.8*  MG  --  1.7  --    GFR Estimated Creatinine Clearance: 76.6 mL/min (by C-G formula based on SCr of 0.54 mg/dL). Liver Function Tests: No results for input(s): "AST", "ALT", "ALKPHOS", "BILITOT", "PROT", "ALBUMIN" in the last 168 hours. No results for input(s): "LIPASE", "AMYLASE" in the last 168 hours. No results for input(s): "AMMONIA" in the last 168 hours. Coagulation profile No results for input(s): "INR", "PROTIME" in the last 168 hours. COVID-19 Labs  No results for input(s): "DDIMER", "FERRITIN", "LDH", "CRP" in the last 72 hours.  Lab Results  Component Value Date   SARSCOV2NAA NEGATIVE 11/28/2023   SARSCOV2NAA Not Detected 11/29/2020   SARSCOV2NAA Not Detected 07/31/2020   SARSCOV2NAA Not Detected 04/26/2020    CBC: Recent Labs  Lab 11/28/23 1211 11/29/23 0508  WBC 4.3 4.3  NEUTROABS 2.6  --   HGB 12.2 10.7*  HCT 38.0 32.1*  MCV 85.0 82.3  PLT 362 284   Cardiac Enzymes: No results for input(s): "CKTOTAL", "CKMB", "CKMBINDEX", "TROPONINI" in the last 168 hours. BNP (last 3 results) No results for input(s): "PROBNP" in the last 8760 hours. CBG: No results for input(s): "GLUCAP" in the last 168 hours. D-Dimer: No results for input(s): "DDIMER" in the last 72 hours. Hgb A1c: No results for input(s): "HGBA1C" in the last 72 hours. Lipid Profile: No results for input(s): "CHOL", "HDL", "LDLCALC", "TRIG", "CHOLHDL", "LDLDIRECT" in the last 72 hours. Thyroid function studies: No results for input(s): "TSH", "T4TOTAL", "T3FREE", "THYROIDAB" in the last 72 hours.  Invalid input(s): "FREET3" Anemia work up: No results for input(s): "VITAMINB12", "FOLATE", "FERRITIN", "TIBC", "IRON", "RETICCTPCT" in the last  72 hours. Sepsis Labs: Recent Labs  Lab 11/28/23 1211 11/29/23 0508  WBC 4.3 4.3   Microbiology Recent Results (from the past 240 hours)  Resp panel by RT-PCR (RSV, Flu A&B, Covid) Anterior Nasal Swab     Status: None   Collection Time: 11/28/23 10:01 PM   Specimen: Anterior Nasal Swab  Result Value Ref Range Status   SARS Coronavirus 2 by RT PCR NEGATIVE NEGATIVE Final   Influenza A by PCR NEGATIVE NEGATIVE Final   Influenza B by PCR NEGATIVE NEGATIVE Final    Comment: (NOTE) The Xpert Xpress SARS-CoV-2/FLU/RSV plus assay is intended as an aid in the diagnosis of influenza from Nasopharyngeal swab specimens and should not be used as a sole basis for treatment. Nasal washings and aspirates are unacceptable for Xpert Xpress SARS-CoV-2/FLU/RSV testing.  Fact Sheet for Patients: BloggerCourse.com  Fact Sheet for Healthcare Providers: SeriousBroker.it  This test is  not yet approved or cleared by the [place]  FDA and has been authorized for detection and/or diagnosis of SARS-CoV-2 by FDA under an Emergency Use Authorization (EUA). This EUA will remain in effect (meaning this test can be used) for the duration of the COVID-19 declaration under Section 564(b)(1) of the Act, 21 U.S.C. section 360bbb-3(b)(1), unless the authorization is terminated or revoked.     Resp Syncytial Virus by PCR NEGATIVE NEGATIVE Final    Comment: (NOTE) Fact Sheet for Patients: BloggerCourse.com  Fact Sheet for Healthcare Providers: SeriousBroker.it  This test is not yet approved or cleared by the [place]  FDA and has been authorized for detection and/or diagnosis of SARS-CoV-2 by FDA under an Emergency Use Authorization (EUA). This EUA will remain in effect (meaning this test can be used) for the duration of the COVID-19 declaration under Section 564(b)(1) of the Act, 21 U.S.C. section  360bbb-3(b)(1), unless the authorization is terminated or revoked.  Performed at [facility], [address] [place], [place] [address]      Medications:    apixaban  10 mg Oral BID   Followed by   [name] ON 12/05/2023] apixaban  5 mg Oral BID   doxycycline  100 mg Oral Q12H   sodium chloride flush  3 mL Intravenous Q12H   Continuous Infusions:  cefTRIAXone (ROCEPHIN)  IV     lactated ringers 100 mL/hr at 11/28/23 2237      LOS: 0 days   [name]  [facility]  11/29/2023, 6:22 AM

## 2023-11-30 DIAGNOSIS — Z1152 Encounter for screening for COVID-19: Secondary | ICD-10-CM | POA: Diagnosis not present

## 2023-11-30 DIAGNOSIS — D649 Anemia, unspecified: Secondary | ICD-10-CM | POA: Diagnosis present

## 2023-11-30 DIAGNOSIS — I10 Essential (primary) hypertension: Secondary | ICD-10-CM | POA: Diagnosis not present

## 2023-11-30 DIAGNOSIS — I2489 Other forms of acute ischemic heart disease: Secondary | ICD-10-CM | POA: Diagnosis present

## 2023-11-30 DIAGNOSIS — Z8249 Family history of ischemic heart disease and other diseases of the circulatory system: Secondary | ICD-10-CM | POA: Diagnosis not present

## 2023-11-30 DIAGNOSIS — N179 Acute kidney failure, unspecified: Secondary | ICD-10-CM | POA: Diagnosis not present

## 2023-11-30 DIAGNOSIS — Z713 Dietary counseling and surveillance: Secondary | ICD-10-CM | POA: Diagnosis not present

## 2023-11-30 DIAGNOSIS — I5041 Acute combined systolic (congestive) and diastolic (congestive) heart failure: Secondary | ICD-10-CM | POA: Diagnosis present

## 2023-11-30 DIAGNOSIS — J189 Pneumonia, unspecified organism: Secondary | ICD-10-CM

## 2023-11-30 DIAGNOSIS — I824Y2 Acute embolism and thrombosis of unspecified deep veins of left proximal lower extremity: Secondary | ICD-10-CM

## 2023-11-30 DIAGNOSIS — R55 Syncope and collapse: Secondary | ICD-10-CM

## 2023-11-30 DIAGNOSIS — Z90710 Acquired absence of both cervix and uterus: Secondary | ICD-10-CM | POA: Diagnosis not present

## 2023-11-30 DIAGNOSIS — E876 Hypokalemia: Secondary | ICD-10-CM | POA: Diagnosis present

## 2023-11-30 DIAGNOSIS — Z8701 Personal history of pneumonia (recurrent): Secondary | ICD-10-CM | POA: Diagnosis not present

## 2023-11-30 DIAGNOSIS — I340 Nonrheumatic mitral (valve) insufficiency: Secondary | ICD-10-CM | POA: Diagnosis not present

## 2023-11-30 DIAGNOSIS — I483 Typical atrial flutter: Secondary | ICD-10-CM | POA: Diagnosis present

## 2023-11-30 DIAGNOSIS — I4891 Unspecified atrial fibrillation: Secondary | ICD-10-CM | POA: Diagnosis not present

## 2023-11-30 DIAGNOSIS — Z7901 Long term (current) use of anticoagulants: Secondary | ICD-10-CM | POA: Diagnosis not present

## 2023-11-30 DIAGNOSIS — Z79899 Other long term (current) drug therapy: Secondary | ICD-10-CM | POA: Diagnosis not present

## 2023-11-30 DIAGNOSIS — I82442 Acute embolism and thrombosis of left tibial vein: Secondary | ICD-10-CM | POA: Diagnosis present

## 2023-11-30 DIAGNOSIS — Z91411 Personal history of adult psychological abuse: Secondary | ICD-10-CM | POA: Diagnosis not present

## 2023-11-30 DIAGNOSIS — R7989 Other specified abnormal findings of blood chemistry: Secondary | ICD-10-CM | POA: Diagnosis not present

## 2023-11-30 DIAGNOSIS — J123 Human metapneumovirus pneumonia: Secondary | ICD-10-CM | POA: Diagnosis present

## 2023-11-30 DIAGNOSIS — I5021 Acute systolic (congestive) heart failure: Secondary | ICD-10-CM

## 2023-11-30 DIAGNOSIS — Z91010 Allergy to peanuts: Secondary | ICD-10-CM | POA: Diagnosis not present

## 2023-11-30 DIAGNOSIS — Z9049 Acquired absence of other specified parts of digestive tract: Secondary | ICD-10-CM | POA: Diagnosis not present

## 2023-11-30 DIAGNOSIS — Z833 Family history of diabetes mellitus: Secondary | ICD-10-CM | POA: Diagnosis not present

## 2023-11-30 DIAGNOSIS — E860 Dehydration: Secondary | ICD-10-CM | POA: Diagnosis present

## 2023-11-30 DIAGNOSIS — Z87891 Personal history of nicotine dependence: Secondary | ICD-10-CM | POA: Diagnosis not present

## 2023-11-30 DIAGNOSIS — I9589 Other hypotension: Secondary | ICD-10-CM | POA: Diagnosis present

## 2023-11-30 DIAGNOSIS — I4892 Unspecified atrial flutter: Secondary | ICD-10-CM | POA: Diagnosis present

## 2023-11-30 DIAGNOSIS — Z91018 Allergy to other foods: Secondary | ICD-10-CM | POA: Diagnosis not present

## 2023-11-30 MED ORDER — GUAIFENESIN ER 600 MG PO TB12
600.0000 mg | ORAL_TABLET | Freq: Two times a day (BID) | ORAL | Status: DC | PRN
  Administered 2023-11-30: 600 mg via ORAL
  Filled 2023-11-30 (×2): qty 1

## 2023-11-30 MED ORDER — SODIUM CHLORIDE 0.9% FLUSH: 3.0000 mL | INTRAVENOUS | Status: DC | PRN

## 2023-11-30 MED ORDER — FUROSEMIDE 10 MG/ML IJ SOLN
20.0000 mg | Freq: Two times a day (BID) | INTRAMUSCULAR | Status: AC
  Administered 2023-11-30 (×2): 20 mg via INTRAVENOUS
  Filled 2023-11-30 (×2): qty 2

## 2023-11-30 MED ORDER — MENTHOL 3 MG MT LOZG
1.0000 | LOZENGE | OROMUCOSAL | Status: DC | PRN
  Administered 2023-11-30: 3 mg via ORAL
  Filled 2023-11-30: qty 9

## 2023-11-30 MED ORDER — SODIUM CHLORIDE 0.9% FLUSH
3.0000 mL | Freq: Two times a day (BID) | INTRAVENOUS | Status: DC
Start: 2023-11-30 — End: 2023-12-01
  Administered 2023-11-30: 3 mL via INTRAVENOUS
  Administered 2023-12-01: 10 mL via INTRAVENOUS

## 2023-11-30 NOTE — H&P (View-Only) (Signed)
 Rounding Note    Patient Name: [name] Date of Encounter: 11/30/2023  [facility] Cardiologist: None   Subjective   Reports feeling much better than yesterday  Discussed possible TEE guided cardioversion, placed on schedule for tomorrow  NPO at midnight   Inpatient Medications    Scheduled Meds:  apixaban  10 mg Oral BID   Followed by   Cecily Cohen ON 12/05/2023] apixaban  5 mg Oral BID   doxycycline  100 mg Oral Q12H   furosemide  20 mg Intravenous BID   metoprolol tartrate  12.5 mg Oral BID   sodium chloride flush  3 mL Intravenous Q12H   Continuous Infusions:  ampicillin-sulbactam (UNASYN) IV 1.5 g (11/30/23 0229)   PRN Meds: acetaminophen **OR** acetaminophen, bisacodyl, guaiFENesin, menthol-cetylpyridinium, metoprolol tartrate, ondansetron (ZOFRAN) IV, senna-docusate   Vital Signs    Vitals:   11/29/23 2346 11/30/23 0416 11/30/23 0500 11/30/23 0806  BP: 112/76 117/86  (!) 134/94  Pulse: 75   69  Resp: 16 18  18   Temp: 97.8 F (36.6 C) 98.1 F (36.7 C)  (!) 97.3 F (36.3 C)  TempSrc: Oral Oral  Oral  SpO2: 96% 96%    Weight:   92.9 kg   Height:        Intake/Output Summary (Last 24 hours) at 11/30/2023 0910 Last data filed at 11/30/2023 0317 Gross per 24 hour  Intake 424.64 ml  Output --  Net 424.64 ml      11/30/2023    5:00 AM 11/28/2023   12:11 PM 02/18/2023    1:34 PM  Last 3 Weights  Weight (lbs) 204 lb 11.2 oz 200 lb 9.9 oz 200 lb  Weight (kg) 92.851 kg 91 kg 90.719 kg     Telemetry    Atrial flutter, HR 80s - Personally Reviewed  Physical Exam   GEN: No acute distress.   Neck: No JVD Cardiac: irregularly irregular, no murmurs, rubs, or gallops.  Respiratory: Clear to auscultation bilaterally. GI: Soft, nontender, non-distended  MS: No edema; No deformity. Neuro:  Nonfocal  Psych: Normal affect   Labs    High Sensitivity Troponin:   Recent Labs  Lab 11/28/23 1211 11/28/23 1645 11/28/23 2214  TROPONINIHS 24*  69* 42*     Chemistry Recent Labs  Lab 11/28/23 1211 11/28/23 2214 11/29/23 0508  NA 141  --  140  K 3.4*  --  4.0  CL 107  --  109  CO2 20*  --  22  GLUCOSE 104*  --  87  BUN 21  --  8  CREATININE 0.88  --  0.54  CALCIUM 9.2  --  8.8*  MG  --  1.7  --   GFRNONAA >60  --  >60  ANIONGAP 14  --  9    Lipids No results for input(s): "CHOL", "TRIG", "HDL", "LABVLDL", "LDLCALC", "CHOLHDL" in the last 168 hours.  Hematology Recent Labs  Lab 11/28/23 1211 11/29/23 0508  WBC 4.3 4.3  RBC 4.47 3.90  HGB 12.2 10.7*  HCT 38.0 32.1*  MCV 85.0 82.3  MCH 27.3 27.4  MCHC 32.1 33.3  RDW 15.4 15.4  PLT 362 284   Thyroid No results for input(s): "TSH", "FREET4" in the last 168 hours.  BNPNo results for input(s): "BNP", "PROBNP" in the last 168 hours.  DDimer No results for input(s): "DDIMER" in the last 168 hours.   Radiology    ECHOCARDIOGRAM COMPLETE Result Date: 11/29/2023    ECHOCARDIOGRAM REPORT  Patient Name:   [name] Date of Exam: 11/29/2023 Medical Rec #:  [id number]           Height:       64.0 in Accession #:    [id number]          Weight:       200.6 lb Date of Birth:  06-27-1956          BSA:          1.959 m Patient Age:    67 years            BP:           121/86 mmHg Patient Gender: F                   HR:           97 bpm. Exam Location:  Inpatient Procedure: 2D Echo, Color Doppler and Cardiac Doppler (Both Spectral and Color            Flow Doppler were utilized during procedure). Indications:    Atrial flutter  History:        Patient has no prior history of Echocardiogram examinations.                 DVT.  Sonographer:    Juanita Shaw Referring Phys: [id number] VISHAL R PATEL IMPRESSIONS  1. Left ventricular ejection fraction, by estimation, is 40 to 45%. The left ventricle has mildly decreased function. The left ventricle demonstrates global hypokinesis. Left ventricular diastolic parameters are indeterminate.  2. Right ventricular systolic function is mildly  reduced. The right ventricular size is normal. Tricuspid regurgitation signal is inadequate for assessing PA pressure.  3. Left atrial size was mildly dilated.  4. The mitral valve is normal in structure. Trivial mitral valve regurgitation. No evidence of mitral stenosis. Moderate mitral annular calcification.  5. The aortic valve is tricuspid. There is mild calcification of the aortic valve. Aortic valve regurgitation is not visualized. No aortic stenosis is present.  6. The inferior vena cava is dilated in size with <50% respiratory variability, suggesting right atrial pressure of 15 mmHg.  7. The patient was in atrial flutter.  8. Technically difficult study with poor acoustic windows. FINDINGS  Left Ventricle: Left ventricular ejection fraction, by estimation, is 40 to 45%. The left ventricle has mildly decreased function. The left ventricle demonstrates global hypokinesis. The left ventricular internal cavity size was normal in size. There is  no left ventricular hypertrophy. Left ventricular diastolic parameters are indeterminate. Right Ventricle: The right ventricular size is normal. No increase in right ventricular wall thickness. Right ventricular systolic function is mildly reduced. Tricuspid regurgitation signal is inadequate for assessing PA pressure. Left Atrium: Left atrial size was mildly dilated. Right Atrium: Right atrial size was normal in size. Pericardium: There is no evidence of pericardial effusion. Mitral Valve: The mitral valve is normal in structure. Moderate mitral annular calcification. Trivial mitral valve regurgitation. No evidence of mitral valve stenosis. MV peak gradient, 5.9 mmHg. The mean mitral valve gradient is 2.0 mmHg. Tricuspid Valve: The tricuspid valve is normal in structure. Tricuspid valve regurgitation is trivial. Aortic Valve: The aortic valve is tricuspid. There is mild calcification of the aortic valve. Aortic valve regurgitation is not visualized. No aortic stenosis is  present. Aortic valve mean gradient measures 3.0 mmHg. Aortic valve peak gradient measures 4.9 mmHg. Aortic valve area, by VTI measures 2.96 cm. Pulmonic Valve: The pulmonic valve was normal in  structure. Pulmonic valve regurgitation is trivial. Aorta: The aortic root is normal in size and structure. Venous: The inferior vena cava is dilated in size with less than 50% respiratory variability, suggesting right atrial pressure of 15 mmHg. IAS/Shunts: No atrial level shunt detected by color flow Doppler.  LEFT VENTRICLE PLAX 2D LVIDd:         4.70 cm      Diastology LVIDs:         3.30 cm      LV e' medial:    12.70 cm/s LV PW:         0.70 cm      LV E/e' medial:  8.5 LV IVS:        0.80 cm      LV e' lateral:   12.00 cm/s LVOT diam:     2.00 cm      LV E/e' lateral: 9.0 LV SV:         50 LV SV Index:   25 LVOT Area:     3.14 cm  LV Volumes (MOD) LV vol d, MOD A2C: 116.0 ml LV vol d, MOD A4C: 137.0 ml LV vol s, MOD A2C: 41.3 ml LV vol s, MOD A4C: 59.0 ml LV SV MOD A2C:     74.7 ml LV SV MOD A4C:     137.0 ml LV SV MOD BP:      77.8 ml RIGHT VENTRICLE             IVC RV Basal diam:  3.00 cm     IVC diam: 2.10 cm RV Mid diam:    1.90 cm RV S prime:     10.10 cm/s TAPSE (M-mode): 2.9 cm LEFT ATRIUM           Index        RIGHT ATRIUM           Index LA diam:      4.50 cm 2.30 cm/m   RA Area:     11.90 cm LA Vol (A2C): 41.5 ml 21.18 ml/m  RA Volume:   24.00 ml  12.25 ml/m LA Vol (A4C): 61.4 ml 31.34 ml/m  AORTIC VALVE                    PULMONIC VALVE AV Area (Vmax):    2.43 cm     PV Vmax:       0.65 m/s AV Area (Vmean):   2.42 cm     PV Peak grad:  1.7 mmHg AV Area (VTI):     2.96 cm AV Vmax:           110.53 cm/s AV Vmean:          78.433 cm/s AV VTI:            0.168 m AV Peak Grad:      4.9 mmHg AV Mean Grad:      3.0 mmHg LVOT Vmax:         85.40 cm/s LVOT Vmean:        60.500 cm/s LVOT VTI:          0.158 m LVOT/AV VTI ratio: 0.94  AORTA Ao Root diam: 3.30 cm Ao Asc diam:  3.00 cm MITRAL VALVE MV Area  (PHT): 4.01 cm     SHUNTS MV Area VTI:   1.80 cm     Systemic VTI:  0.16 m MV Peak grad:  5.9 mmHg     Systemic Diam:  2.00 cm MV Mean grad:  2.0 mmHg MV Vmax:       1.21 m/s MV Vmean:      69.7 cm/s MV Decel Time: 189 msec MV E velocity: 108.00 cm/s Dalton McleanMD Electronically signed by Archer Bear Signature Date/Time: 11/29/2023/7:39:26 PM    Final    VAS US  LOWER EXTREMITY VENOUS (DVT) (7a-7p) Result Date: 11/29/2023  Lower Venous DVT Study Patient Name:  [name]  Date of Exam:   11/28/2023 Medical Rec #: [id number]            Accession #:    [id number] Date of Birth: 1955/08/26           Patient Gender: F Patient Age:   14 years Exam Location:  [facility] Procedure:      VAS US  LOWER EXTREMITY VENOUS (DVT) Referring Phys: AMJAD ALI --------------------------------------------------------------------------------  Indications: Swelling, and Dizziness, hypotension. Aflutter with RVR Other Indications: New diagnosis of Aflutter by primary care, prescribed Eliquis                    but delayed starting it. Risk Factors: Aflutter. Comparison Study: No prior study on file Performing Technologist: Carleene Chase RVS  Examination Guidelines: A complete evaluation includes B-mode imaging, spectral Doppler, color Doppler, and power Doppler as needed of all accessible portions of each vessel. Bilateral testing is considered an integral part of a complete examination. Limited examinations for reoccurring indications may be performed as noted. The reflux portion of the exam is performed with the patient in reverse Trendelenburg.  +-----+---------------+---------+-----------+----------+--------------+ RIGHTCompressibilityPhasicitySpontaneityPropertiesThrombus Aging +-----+---------------+---------+-----------+----------+--------------+ CFV  Full           Yes      Yes                                 +-----+---------------+---------+-----------+----------+--------------+    +---------+---------------+---------+-----------+----------+--------------+ LEFT     CompressibilityPhasicitySpontaneityPropertiesThrombus Aging +---------+---------------+---------+-----------+----------+--------------+ CFV      Full           Yes      Yes                                 +---------+---------------+---------+-----------+----------+--------------+ SFJ      Full                                                        +---------+---------------+---------+-----------+----------+--------------+ FV Prox  Full                                                        +---------+---------------+---------+-----------+----------+--------------+ FV Mid   Full                                                        +---------+---------------+---------+-----------+----------+--------------+ FV DistalFull                                                        +---------+---------------+---------+-----------+----------+--------------+  PFV      Full                                                        +---------+---------------+---------+-----------+----------+--------------+ POP      Full           Yes      Yes                                 +---------+---------------+---------+-----------+----------+--------------+ PTV      Partial                                      Acute          +---------+---------------+---------+-----------+----------+--------------+ PERO     Partial                                      Acute          +---------+---------------+---------+-----------+----------+--------------+    Summary: RIGHT: - No evidence of common femoral vein obstruction.   LEFT: - Findings consistent with acute deep vein thrombosis involving the proximal segments of the left posterior tibial veins, and left peroneal veins.   *See table(s) above for measurements and observations. Electronically signed by Genny Kid MD on 11/29/2023 at 3:43:02 PM.     Final    CT Angio Chest PE W and/or Wo Contrast Result Date: 11/28/2023 CLINICAL DATA:  Pulmonary embolism (PE) suspected, high prob Syncope. EXAM: CT ANGIOGRAPHY CHEST WITH CONTRAST TECHNIQUE: Multidetector CT imaging of the chest was performed using the standard protocol during bolus administration of intravenous contrast. Multiplanar CT image reconstructions and MIPs were obtained to evaluate the vascular anatomy. RADIATION DOSE REDUCTION: This exam was performed according to the departmental dose-optimization program which includes automated exposure control, adjustment of the mA and/or kV according to patient size and/or use of iterative reconstruction technique. CONTRAST:  75mL OMNIPAQUE IOHEXOL 350 MG/ML SOLN COMPARISON:  Radiograph earlier today FINDINGS: Cardiovascular: There are no filling defects within the pulmonary arteries to suggest pulmonary embolus. The heart is normal in size. No pericardial effusion. Trace atherosclerosis of the thoracic aorta. No aneurysm. Mediastinum/Nodes: Prominent left hilar nodes measuring up to 10 mm, likely reactive. There also prominent prevascular nodes measuring up to 8 mm short axis. Calcified left thyroid nodule measuring 11 mm in the left lobe. Not clinically significant; no follow-up imaging recommended (ref: J Am Coll Radiol. 2015 Feb;12(2): 143-50). Unremarkable esophagus. Lungs/Pleura: Patchy ground-glass CP and nodular opacities in the left upper and to a lesser extent lower lobe typical of pneumonia. There is also minimal dependent opacity in the right lower lobe. Mild central bronchial thickening. No pleural fluid. Trachea and central airways are clear. Upper Abdomen: No acute findings.  Cholecystectomy. Musculoskeletal: There are no acute or suspicious osseous abnormalities. Thoracic spondylosis. Review of the MIP images confirms the above findings. IMPRESSION: 1. No pulmonary embolus. 2. Patchy ground-glass the and nodular opacities in the left upper and  to a lesser extent lower lobe typical of pneumonia. 3. Prominent left hilar and prevascular nodes, likely reactive. Aortic Atherosclerosis (ICD10-I70.0). Electronically Signed  By: Chadwick Colonel M.D.   On: 11/28/2023 19:34   DG Chest Portable 1 View Result Date: 11/28/2023 CLINICAL DATA:  Syncope EXAM: PORTABLE CHEST 1 VIEW COMPARISON:  Chest radiograph dated 01/11/2011 FINDINGS: Normal lung volumes. Multifocal patchy left lung opacities. No pleural effusion or pneumothorax. The heart size and mediastinal contours are within normal limits. No acute osseous abnormality. IMPRESSION: Multifocal patchy left lung opacities, which may represent multifocal aspiration or pneumonia. Electronically Signed   By: Limin  Xu M.D.   On: 11/28/2023 13:49   Cardiac Studies   Echocardiogram, 11/28/2023 Left ventricular ejection fraction, by estimation, is 40 to 45% . The left ventricle has mildly decreased function. The left ventricle demonstrates global hypokinesis. Left ventricular diastolic parameters are indeterminate.  Right ventricular systolic function is mildly reduced. The right ventricular size is normal. Tricuspid regurgitation signal is inadequate for assessing PA pressure.  Left atrial size was mildly dilated.  The mitral valve is normal in structure. Trivial mitral valve regurgitation. No evidence of mitral stenosis. Moderate mitral annular calcification.  The aortic valve is tricuspid. There is mild calcification of the aortic valve. Aortic valve regurgitation is not visualized. No aortic stenosis is present.  The inferior vena cava is dilated in size with < 50% respiratory variability, suggesting right atrial pressure of 15 mmHg.  The patient was in atrial flutter.  Technically difficult study with poor acoustic windows.  Patient Profile     68 y.o. female with a hx of atrial flutter who is being seen for the atrial flutter   Assessment & Plan    Atrial flutter with RVR  Echo this admission  showed: EF 40-45%, mildly reduced RV function, mildly dilated LA HR currently in 80s, remains in atrial flutter BP stable  Discussed TEE guided cardioversion with patient -- she is open to the idea, wants to wait for her husband to arrive to discuss with him for final decision  Placed on schedule for TEE guided DCCV tomorrow 5/14  NPO at midnight  Continue on Lopressor 12.5 mg BID  Continue on Eliquis per DVT protocol -- 10 mg BID followed by 5 mg BID   Per primary  Atypical PNA Hypotension Acute DVT of LLE Hypokalemia  Anemia     For questions or updates, please contact [facility] Please consult [email or web address] for contact info under        Signed, Jiles Mote, PA-C  11/30/2023, 9:10 AM

## 2023-11-30 NOTE — Progress Notes (Signed)
 [facility] PROGRESS NOTE    Progress Note  [name]  JWJ:[id number] DOB: 26-Jan-1956 DOA: 11/28/2023 PCP: Patient, No Pcp Per     Brief Narrative:   [name] is an 68 y.o. female past medical history of paroxysmal atrial flutter comes into the ED after near syncopal episode she was at church when she began feeling lightheaded did not lose consciousness, CTA of the chest was negative for PE it did show patchy groundglass opacities in the left and lower lobe segment suspicion for pneumonia and hilar lymphadenopathy.  Assessment/Plan:   Near syncope: Started on IV fluids overnight. Likely multifactorial in the setting of atrial flutter and community-acquired pneumonia. 2D echo showed an EF of 45% global hypokinesia.  Paroxysmal atrial flutter/A-flutter: Was hypotension and initially seen by cardiology Cardiology was consulted as he remained in a flutter recommended TEE cardioversion on 12/01/2023. Continue Lopressor and Eliquis.  Left upper lobe opacity question viral pneumonia versus aspiration pneumonia: Seen on imaging. Viral process versus noninfectious etiology. Possible aspiration event as she woke up during the night coughing. Continue IV Unasyn and azithromycin. Continue oxygen. SARS-CoV-2/influenza and RSV panel negative.  Hypotension: Likely due to dehydration and possibly underlying pneumonia  Acute deep vein thrombosis (DVT) of tibial vein of left lower extremity (HCC) Lower extremity Doppler showed DVT on the left posterior tibial and peroneal vein. Continue Eliquis.  Hypokalemia Repleted orally now improved.  Normocytic anemia: Continue monitoring signs of her bleeding.   DVT prophylaxis: Eliquis Family Communication:none Status is: Observation The patient remains OBS appropriate and will d/c before 2 midnights.    Code Status:     Code Status Orders  (From admission, onward)           Start     Ordered   11/28/23  2217  Full code  Continuous       Question:  By:  Answer:  Consent: discussion documented in EHR   11/28/23 2218           Code Status History     This patient has a current code status but no historical code status.      Advance Directive Documentation    Flowsheet Row Most Recent Value  Type of Advance Directive Living will  Pre-existing out of facility DNR order (yellow form or pink MOST form) --  "MOST" Form in Place? --         IV Access:   Peripheral IV   Procedures and diagnostic studies:   ECHOCARDIOGRAM COMPLETE Result Date: 11/29/2023    ECHOCARDIOGRAM REPORT   Patient Name:   [name] Date of Exam: 11/29/2023 Medical Rec #:  [id number]           Height:       64.0 in Accession #:    [id number]          Weight:       200.6 lb Date of Birth:  01/29/1956          BSA:          1.959 m Patient Age:    67 years            BP:           121/86 mmHg Patient Gender: F                   HR:           97 bpm. Exam Location:  Inpatient Procedure: 2D Echo, Color Doppler  and Cardiac Doppler (Both Spectral and Color            Flow Doppler were utilized during procedure). Indications:    Atrial flutter  History:        Patient has no prior history of Echocardiogram examinations.                 DVT.  Sonographer:    Juanita Shaw Referring Phys: [id number] VISHAL R PATEL IMPRESSIONS  1. Left ventricular ejection fraction, by estimation, is 40 to 45%. The left ventricle has mildly decreased function. The left ventricle demonstrates global hypokinesis. Left ventricular diastolic parameters are indeterminate.  2. Right ventricular systolic function is mildly reduced. The right ventricular size is normal. Tricuspid regurgitation signal is inadequate for assessing PA pressure.  3. Left atrial size was mildly dilated.  4. The mitral valve is normal in structure. Trivial mitral valve regurgitation. No evidence of mitral stenosis. Moderate mitral annular calcification.  5. The aortic  valve is tricuspid. There is mild calcification of the aortic valve. Aortic valve regurgitation is not visualized. No aortic stenosis is present.  6. The inferior vena cava is dilated in size with <50% respiratory variability, suggesting right atrial pressure of 15 mmHg.  7. The patient was in atrial flutter.  8. Technically difficult study with poor acoustic windows. FINDINGS  Left Ventricle: Left ventricular ejection fraction, by estimation, is 40 to 45%. The left ventricle has mildly decreased function. The left ventricle demonstrates global hypokinesis. The left ventricular internal cavity size was normal in size. There is  no left ventricular hypertrophy. Left ventricular diastolic parameters are indeterminate. Right Ventricle: The right ventricular size is normal. No increase in right ventricular wall thickness. Right ventricular systolic function is mildly reduced. Tricuspid regurgitation signal is inadequate for assessing PA pressure. Left Atrium: Left atrial size was mildly dilated. Right Atrium: Right atrial size was normal in size. Pericardium: There is no evidence of pericardial effusion. Mitral Valve: The mitral valve is normal in structure. Moderate mitral annular calcification. Trivial mitral valve regurgitation. No evidence of mitral valve stenosis. MV peak gradient, 5.9 mmHg. The mean mitral valve gradient is 2.0 mmHg. Tricuspid Valve: The tricuspid valve is normal in structure. Tricuspid valve regurgitation is trivial. Aortic Valve: The aortic valve is tricuspid. There is mild calcification of the aortic valve. Aortic valve regurgitation is not visualized. No aortic stenosis is present. Aortic valve mean gradient measures 3.0 mmHg. Aortic valve peak gradient measures 4.9 mmHg. Aortic valve area, by VTI measures 2.96 cm. Pulmonic Valve: The pulmonic valve was normal in structure. Pulmonic valve regurgitation is trivial. Aorta: The aortic root is normal in size and structure. Venous: The inferior  vena cava is dilated in size with less than 50% respiratory variability, suggesting right atrial pressure of 15 mmHg. IAS/Shunts: No atrial level shunt detected by color flow Doppler.  LEFT VENTRICLE PLAX 2D LVIDd:         4.70 cm      Diastology LVIDs:         3.30 cm      LV e' medial:    12.70 cm/s LV PW:         0.70 cm      LV E/e' medial:  8.5 LV IVS:        0.80 cm      LV e' lateral:   12.00 cm/s LVOT diam:     2.00 cm      LV E/e' lateral: 9.0 LV SV:  50 LV SV Index:   25 LVOT Area:     3.14 cm  LV Volumes (MOD) LV vol d, MOD A2C: 116.0 ml LV vol d, MOD A4C: 137.0 ml LV vol s, MOD A2C: 41.3 ml LV vol s, MOD A4C: 59.0 ml LV SV MOD A2C:     74.7 ml LV SV MOD A4C:     137.0 ml LV SV MOD BP:      77.8 ml RIGHT VENTRICLE             IVC RV Basal diam:  3.00 cm     IVC diam: 2.10 cm RV Mid diam:    1.90 cm RV S prime:     10.10 cm/s TAPSE (M-mode): 2.9 cm LEFT ATRIUM           Index        RIGHT ATRIUM           Index LA diam:      4.50 cm 2.30 cm/m   RA Area:     11.90 cm LA Vol (A2C): 41.5 ml 21.18 ml/m  RA Volume:   24.00 ml  12.25 ml/m LA Vol (A4C): 61.4 ml 31.34 ml/m  AORTIC VALVE                    PULMONIC VALVE AV Area (Vmax):    2.43 cm     PV Vmax:       0.65 m/s AV Area (Vmean):   2.42 cm     PV Peak grad:  1.7 mmHg AV Area (VTI):     2.96 cm AV Vmax:           110.53 cm/s AV Vmean:          78.433 cm/s AV VTI:            0.168 m AV Peak Grad:      4.9 mmHg AV Mean Grad:      3.0 mmHg LVOT Vmax:         85.40 cm/s LVOT Vmean:        60.500 cm/s LVOT VTI:          0.158 m LVOT/AV VTI ratio: 0.94  AORTA Ao Root diam: 3.30 cm Ao Asc diam:  3.00 cm MITRAL VALVE MV Area (PHT): 4.01 cm     SHUNTS MV Area VTI:   1.80 cm     Systemic VTI:  0.16 m MV Peak grad:  5.9 mmHg     Systemic Diam: 2.00 cm MV Mean grad:  2.0 mmHg MV Vmax:       1.21 m/s MV Vmean:      69.7 cm/s MV Decel Time: 189 msec MV E velocity: 108.00 cm/s Dalton McleanMD Electronically signed by Archer Bear Signature  Date/Time: 11/29/2023/7:39:26 PM    Final    VAS US  LOWER EXTREMITY VENOUS (DVT) (7a-7p) Result Date: 11/29/2023  Lower Venous DVT Study Patient Name:  [name]  Date of Exam:   11/28/2023 Medical Rec #: [id number]            Accession #:    [id number] Date of Birth: 09-Oct-1955           Patient Gender: F Patient Age:   44 years Exam Location:  [facility] Procedure:      VAS US  LOWER EXTREMITY VENOUS (DVT) Referring Phys: AMJAD ALI --------------------------------------------------------------------------------  Indications: Swelling, and Dizziness, hypotension. Aflutter with RVR Other Indications: New diagnosis of Aflutter by  primary care, prescribed Eliquis                    but delayed starting it. Risk Factors: Aflutter. Comparison Study: No prior study on file Performing Technologist: Carleene Chase RVS  Examination Guidelines: A complete evaluation includes B-mode imaging, spectral Doppler, color Doppler, and power Doppler as needed of all accessible portions of each vessel. Bilateral testing is considered an integral part of a complete examination. Limited examinations for reoccurring indications may be performed as noted. The reflux portion of the exam is performed with the patient in reverse Trendelenburg.  +-----+---------------+---------+-----------+----------+--------------+ RIGHTCompressibilityPhasicitySpontaneityPropertiesThrombus Aging +-----+---------------+---------+-----------+----------+--------------+ CFV  Full           Yes      Yes                                 +-----+---------------+---------+-----------+----------+--------------+   +---------+---------------+---------+-----------+----------+--------------+ LEFT     CompressibilityPhasicitySpontaneityPropertiesThrombus Aging +---------+---------------+---------+-----------+----------+--------------+ CFV      Full           Yes      Yes                                  +---------+---------------+---------+-----------+----------+--------------+ SFJ      Full                                                        +---------+---------------+---------+-----------+----------+--------------+ FV Prox  Full                                                        +---------+---------------+---------+-----------+----------+--------------+ FV Mid   Full                                                        +---------+---------------+---------+-----------+----------+--------------+ FV DistalFull                                                        +---------+---------------+---------+-----------+----------+--------------+ PFV      Full                                                        +---------+---------------+---------+-----------+----------+--------------+ POP      Full           Yes      Yes                                 +---------+---------------+---------+-----------+----------+--------------+ PTV      Partial  Acute          +---------+---------------+---------+-----------+----------+--------------+ PERO     Partial                                      Acute          +---------+---------------+---------+-----------+----------+--------------+    Summary: RIGHT: - No evidence of common femoral vein obstruction.   LEFT: - Findings consistent with acute deep vein thrombosis involving the proximal segments of the left posterior tibial veins, and left peroneal veins.   *See table(s) above for measurements and observations. Electronically signed by Genny Kid MD on 11/29/2023 at 3:43:02 PM.    Final    CT Angio Chest PE W and/or Wo Contrast Result Date: 11/28/2023 CLINICAL DATA:  Pulmonary embolism (PE) suspected, high prob Syncope. EXAM: CT ANGIOGRAPHY CHEST WITH CONTRAST TECHNIQUE: Multidetector CT imaging of the chest was performed using the standard protocol during bolus  administration of intravenous contrast. Multiplanar CT image reconstructions and MIPs were obtained to evaluate the vascular anatomy. RADIATION DOSE REDUCTION: This exam was performed according to the departmental dose-optimization program which includes automated exposure control, adjustment of the mA and/or kV according to patient size and/or use of iterative reconstruction technique. CONTRAST:  75mL OMNIPAQUE IOHEXOL 350 MG/ML SOLN COMPARISON:  Radiograph earlier today FINDINGS: Cardiovascular: There are no filling defects within the pulmonary arteries to suggest pulmonary embolus. The heart is normal in size. No pericardial effusion. Trace atherosclerosis of the thoracic aorta. No aneurysm. Mediastinum/Nodes: Prominent left hilar nodes measuring up to 10 mm, likely reactive. There also prominent prevascular nodes measuring up to 8 mm short axis. Calcified left thyroid nodule measuring 11 mm in the left lobe. Not clinically significant; no follow-up imaging recommended (ref: J Am Coll Radiol. 2015 Feb;12(2): 143-50). Unremarkable esophagus. Lungs/Pleura: Patchy ground-glass CP and nodular opacities in the left upper and to a lesser extent lower lobe typical of pneumonia. There is also minimal dependent opacity in the right lower lobe. Mild central bronchial thickening. No pleural fluid. Trachea and central airways are clear. Upper Abdomen: No acute findings.  Cholecystectomy. Musculoskeletal: There are no acute or suspicious osseous abnormalities. Thoracic spondylosis. Review of the MIP images confirms the above findings. IMPRESSION: 1. No pulmonary embolus. 2. Patchy ground-glass the and nodular opacities in the left upper and to a lesser extent lower lobe typical of pneumonia. 3. Prominent left hilar and prevascular nodes, likely reactive. Aortic Atherosclerosis (ICD10-I70.0). Electronically Signed   By: Chadwick Colonel M.D.   On: 11/28/2023 19:34   DG Chest Portable 1 View Result Date: 11/28/2023 CLINICAL  DATA:  Syncope EXAM: PORTABLE CHEST 1 VIEW COMPARISON:  Chest radiograph dated 01/11/2011 FINDINGS: Normal lung volumes. Multifocal patchy left lung opacities. No pleural effusion or pneumothorax. The heart size and mediastinal contours are within normal limits. No acute osseous abnormality. IMPRESSION: Multifocal patchy left lung opacities, which may represent multifocal aspiration or pneumonia. Electronically Signed   By: Limin  Xu M.D.   On: 11/28/2023 13:49     Medical Consultants:   None.   Subjective:    [name] no complaints today.  Objective:    Vitals:   11/29/23 2346 11/30/23 0416 11/30/23 0500 11/30/23 0806  BP: 112/76 117/86  (!) 134/94  Pulse: 75   69  Resp: 16 18  18   Temp: 97.8 F (36.6 C) 98.1 F (36.7 C)  (!) 97.3 F (36.3 C)  TempSrc:  Oral Oral  Oral  SpO2: 96% 96%    Weight:   92.9 kg   Height:       SpO2: 96 %   Intake/Output Summary (Last 24 hours) at 11/30/2023 1044 Last data filed at 11/30/2023 0317 Gross per 24 hour  Intake 188.64 ml  Output --  Net 188.64 ml   Filed Weights   11/28/23 1211 11/30/23 0500  Weight: 91 kg 92.9 kg    Exam: General exam: In no acute distress. Respiratory system: Good air movement and clear to auscultation. Cardiovascular system: S1 & S2 heard, RRR. No JVD. Gastrointestinal system: Abdomen is nondistended, soft and nontender.  Extremities: No pedal edema. Skin: No rashes, lesions or ulcers Psychiatry: Judgement and insight appear normal. Mood & affect appropriate.  Data Reviewed:    Labs: Basic Metabolic Panel: Recent Labs  Lab 11/28/23 1211 11/28/23 2214 11/29/23 0508  NA 141  --  140  K 3.4*  --  4.0  CL 107  --  109  CO2 20*  --  22  GLUCOSE 104*  --  87  BUN 21  --  8  CREATININE 0.88  --  0.54  CALCIUM 9.2  --  8.8*  MG  --  1.7  --    GFR Estimated Creatinine Clearance: 77.5 mL/min (by C-G formula based on SCr of 0.54 mg/dL). Liver Function Tests: No results for input(s):  "AST", "ALT", "ALKPHOS", "BILITOT", "PROT", "ALBUMIN" in the last 168 hours. No results for input(s): "LIPASE", "AMYLASE" in the last 168 hours. No results for input(s): "AMMONIA" in the last 168 hours. Coagulation profile No results for input(s): "INR", "PROTIME" in the last 168 hours. COVID-19 Labs  No results for input(s): "DDIMER", "FERRITIN", "LDH", "CRP" in the last 72 hours.  Lab Results  Component Value Date   SARSCOV2NAA NEGATIVE 11/28/2023   SARSCOV2NAA Not Detected 11/29/2020   SARSCOV2NAA Not Detected 07/31/2020   SARSCOV2NAA Not Detected 04/26/2020    CBC: Recent Labs  Lab 11/28/23 1211 11/29/23 0508  WBC 4.3 4.3  NEUTROABS 2.6  --   HGB 12.2 10.7*  HCT 38.0 32.1*  MCV 85.0 82.3  PLT 362 284   Cardiac Enzymes: No results for input(s): "CKTOTAL", "CKMB", "CKMBINDEX", "TROPONINI" in the last 168 hours. BNP (last 3 results) No results for input(s): "PROBNP" in the last 8760 hours. CBG: No results for input(s): "GLUCAP" in the last 168 hours. D-Dimer: No results for input(s): "DDIMER" in the last 72 hours. Hgb A1c: No results for input(s): "HGBA1C" in the last 72 hours. Lipid Profile: No results for input(s): "CHOL", "HDL", "LDLCALC", "TRIG", "CHOLHDL", "LDLDIRECT" in the last 72 hours. Thyroid function studies: No results for input(s): "TSH", "T4TOTAL", "T3FREE", "THYROIDAB" in the last 72 hours.  Invalid input(s): "FREET3" Anemia work up: No results for input(s): "VITAMINB12", "FOLATE", "FERRITIN", "TIBC", "IRON", "RETICCTPCT" in the last 72 hours. Sepsis Labs: Recent Labs  Lab 11/28/23 1211 11/29/23 0508  WBC 4.3 4.3   Microbiology Recent Results (from the past 240 hours)  Resp panel by RT-PCR (RSV, Flu A&B, Covid) Anterior Nasal Swab     Status: None   Collection Time: 11/28/23 10:01 PM   Specimen: Anterior Nasal Swab  Result Value Ref Range Status   SARS Coronavirus 2 by RT PCR NEGATIVE NEGATIVE Final   Influenza A by PCR NEGATIVE NEGATIVE  Final   Influenza B by PCR NEGATIVE NEGATIVE Final    Comment: (NOTE) The Xpert Xpress SARS-CoV-2/FLU/RSV plus assay is intended as an aid in the  diagnosis of influenza from Nasopharyngeal swab specimens and should not be used as a sole basis for treatment. Nasal washings and aspirates are unacceptable for Xpert Xpress SARS-CoV-2/FLU/RSV testing.  Fact Sheet for Patients: BloggerCourse.com  Fact Sheet for Healthcare Providers: SeriousBroker.it  This test is not yet approved or cleared by the [place]  FDA and has been authorized for detection and/or diagnosis of SARS-CoV-2 by FDA under an Emergency Use Authorization (EUA). This EUA will remain in effect (meaning this test can be used) for the duration of the COVID-19 declaration under Section 564(b)(1) of the Act, 21 U.S.C. section 360bbb-3(b)(1), unless the authorization is terminated or revoked.     Resp Syncytial Virus by PCR NEGATIVE NEGATIVE Final    Comment: (NOTE) Fact Sheet for Patients: BloggerCourse.com  Fact Sheet for Healthcare Providers: SeriousBroker.it  This test is not yet approved or cleared by the [place]  FDA and has been authorized for detection and/or diagnosis of SARS-CoV-2 by FDA under an Emergency Use Authorization (EUA). This EUA will remain in effect (meaning this test can be used) for the duration of the COVID-19 declaration under Section 564(b)(1) of the Act, 21 U.S.C. section 360bbb-3(b)(1), unless the authorization is terminated or revoked.  Performed at [facility], [address] [place], [place] [address]      Medications:    apixaban  10 mg Oral BID   Followed by   Cecily Cohen ON 12/05/2023] apixaban  5 mg Oral BID   doxycycline  100 mg Oral Q12H   furosemide  20 mg Intravenous BID   metoprolol tartrate  12.5 mg Oral BID   sodium chloride flush  3 mL Intravenous Q12H    Continuous Infusions:  ampicillin-sulbactam (UNASYN) IV 1.5 g (11/30/23 0229)      LOS: 0 days   Macdonald Savoy  [facility]  11/30/2023, 10:44 AM

## 2023-11-30 NOTE — Plan of Care (Signed)
  Problem: Health Behavior/Discharge Planning: Goal: Ability to manage health-related needs will improve Outcome: Progressing   Problem: Education: Goal: Knowledge of General Education information will improve Description: Including pain rating scale, medication(s)/side effects and non-pharmacologic comfort measures Outcome: Progressing   Problem: Coping: Goal: Level of anxiety will decrease Outcome: Progressing   

## 2023-11-30 NOTE — TOC CM/SW Note (Signed)
 Transition of Care [facility]) - Inpatient Brief Assessment   Patient Details  Name: [name] MRN: [id number] Date of Birth: 1955-07-30  [facility] CM/SW Contact:    Cosimo Diones, RN Phone Number: 11/30/2023, 11:02 AM   Clinical Narrative: Patient presented for lightheadedness. PTA patient was from home with family support. Patient has PCP at [facility] Dr. Sabra Cramp and she will schedule a follow up appointment. Patient had questions regarding cost for TEE cardioversion. Case Manager spoke with [facility] and the patients co pay is $399.00 for the acute care stay. Patient states she would like to mover forward with the cardioversion in hospital. No home needs identified at this time.     Transition of Care Asessment: Insurance and Status: Insurance coverage has been reviewed Patient has primary care physician: Yes Home environment has been reviewed: reviewed Prior level of function:: independent Prior/Current Home Services: No current home services Social Drivers of Health Review: SDOH reviewed no interventions necessary Readmission risk has been reviewed: Yes Transition of care needs: transition of care needs identified, TOC will continue to follow

## 2023-11-30 NOTE — Progress Notes (Signed)
 Rounding Note    Patient Name: [name] Date of Encounter: 11/30/2023  [facility] Cardiologist: None   Subjective   Reports feeling much better than yesterday  Discussed possible TEE guided cardioversion, placed on schedule for tomorrow  NPO at midnight   Inpatient Medications    Scheduled Meds:  apixaban  10 mg Oral BID   Followed by   Cecily Cohen ON 12/05/2023] apixaban  5 mg Oral BID   doxycycline  100 mg Oral Q12H   furosemide  20 mg Intravenous BID   metoprolol tartrate  12.5 mg Oral BID   sodium chloride flush  3 mL Intravenous Q12H   Continuous Infusions:  ampicillin-sulbactam (UNASYN) IV 1.5 g (11/30/23 0229)   PRN Meds: acetaminophen **OR** acetaminophen, bisacodyl, guaiFENesin, menthol-cetylpyridinium, metoprolol tartrate, ondansetron (ZOFRAN) IV, senna-docusate   Vital Signs    Vitals:   11/29/23 2346 11/30/23 0416 11/30/23 0500 11/30/23 0806  BP: 112/76 117/86  (!) 134/94  Pulse: 75   69  Resp: 16 18  18   Temp: 97.8 F (36.6 C) 98.1 F (36.7 C)  (!) 97.3 F (36.3 C)  TempSrc: Oral Oral  Oral  SpO2: 96% 96%    Weight:   92.9 kg   Height:        Intake/Output Summary (Last 24 hours) at 11/30/2023 0910 Last data filed at 11/30/2023 0317 Gross per 24 hour  Intake 424.64 ml  Output --  Net 424.64 ml      11/30/2023    5:00 AM 11/28/2023   12:11 PM 02/18/2023    1:34 PM  Last 3 Weights  Weight (lbs) 204 lb 11.2 oz 200 lb 9.9 oz 200 lb  Weight (kg) 92.851 kg 91 kg 90.719 kg     Telemetry    Atrial flutter, HR 80s - Personally Reviewed  Physical Exam   GEN: No acute distress.   Neck: No JVD Cardiac: irregularly irregular, no murmurs, rubs, or gallops.  Respiratory: Clear to auscultation bilaterally. GI: Soft, nontender, non-distended  MS: No edema; No deformity. Neuro:  Nonfocal  Psych: Normal affect   Labs    High Sensitivity Troponin:   Recent Labs  Lab 11/28/23 1211 11/28/23 1645 11/28/23 2214  TROPONINIHS 24*  69* 42*     Chemistry Recent Labs  Lab 11/28/23 1211 11/28/23 2214 11/29/23 0508  NA 141  --  140  K 3.4*  --  4.0  CL 107  --  109  CO2 20*  --  22  GLUCOSE 104*  --  87  BUN 21  --  8  CREATININE 0.88  --  0.54  CALCIUM 9.2  --  8.8*  MG  --  1.7  --   GFRNONAA >60  --  >60  ANIONGAP 14  --  9    Lipids No results for input(s): "CHOL", "TRIG", "HDL", "LABVLDL", "LDLCALC", "CHOLHDL" in the last 168 hours.  Hematology Recent Labs  Lab 11/28/23 1211 11/29/23 0508  WBC 4.3 4.3  RBC 4.47 3.90  HGB 12.2 10.7*  HCT 38.0 32.1*  MCV 85.0 82.3  MCH 27.3 27.4  MCHC 32.1 33.3  RDW 15.4 15.4  PLT 362 284   Thyroid No results for input(s): "TSH", "FREET4" in the last 168 hours.  BNPNo results for input(s): "BNP", "PROBNP" in the last 168 hours.  DDimer No results for input(s): "DDIMER" in the last 168 hours.   Radiology    ECHOCARDIOGRAM COMPLETE Result Date: 11/29/2023    ECHOCARDIOGRAM REPORT  Patient Name:   [name] Date of Exam: 11/29/2023 Medical Rec #:  [id number]           Height:       64.0 in Accession #:    [id number]          Weight:       200.6 lb Date of Birth:  06-27-1956          BSA:          1.959 m Patient Age:    67 years            BP:           121/86 mmHg Patient Gender: F                   HR:           97 bpm. Exam Location:  Inpatient Procedure: 2D Echo, Color Doppler and Cardiac Doppler (Both Spectral and Color            Flow Doppler were utilized during procedure). Indications:    Atrial flutter  History:        Patient has no prior history of Echocardiogram examinations.                 DVT.  Sonographer:    Juanita Shaw Referring Phys: [id number] VISHAL R PATEL IMPRESSIONS  1. Left ventricular ejection fraction, by estimation, is 40 to 45%. The left ventricle has mildly decreased function. The left ventricle demonstrates global hypokinesis. Left ventricular diastolic parameters are indeterminate.  2. Right ventricular systolic function is mildly  reduced. The right ventricular size is normal. Tricuspid regurgitation signal is inadequate for assessing PA pressure.  3. Left atrial size was mildly dilated.  4. The mitral valve is normal in structure. Trivial mitral valve regurgitation. No evidence of mitral stenosis. Moderate mitral annular calcification.  5. The aortic valve is tricuspid. There is mild calcification of the aortic valve. Aortic valve regurgitation is not visualized. No aortic stenosis is present.  6. The inferior vena cava is dilated in size with <50% respiratory variability, suggesting right atrial pressure of 15 mmHg.  7. The patient was in atrial flutter.  8. Technically difficult study with poor acoustic windows. FINDINGS  Left Ventricle: Left ventricular ejection fraction, by estimation, is 40 to 45%. The left ventricle has mildly decreased function. The left ventricle demonstrates global hypokinesis. The left ventricular internal cavity size was normal in size. There is  no left ventricular hypertrophy. Left ventricular diastolic parameters are indeterminate. Right Ventricle: The right ventricular size is normal. No increase in right ventricular wall thickness. Right ventricular systolic function is mildly reduced. Tricuspid regurgitation signal is inadequate for assessing PA pressure. Left Atrium: Left atrial size was mildly dilated. Right Atrium: Right atrial size was normal in size. Pericardium: There is no evidence of pericardial effusion. Mitral Valve: The mitral valve is normal in structure. Moderate mitral annular calcification. Trivial mitral valve regurgitation. No evidence of mitral valve stenosis. MV peak gradient, 5.9 mmHg. The mean mitral valve gradient is 2.0 mmHg. Tricuspid Valve: The tricuspid valve is normal in structure. Tricuspid valve regurgitation is trivial. Aortic Valve: The aortic valve is tricuspid. There is mild calcification of the aortic valve. Aortic valve regurgitation is not visualized. No aortic stenosis is  present. Aortic valve mean gradient measures 3.0 mmHg. Aortic valve peak gradient measures 4.9 mmHg. Aortic valve area, by VTI measures 2.96 cm. Pulmonic Valve: The pulmonic valve was normal in  structure. Pulmonic valve regurgitation is trivial. Aorta: The aortic root is normal in size and structure. Venous: The inferior vena cava is dilated in size with less than 50% respiratory variability, suggesting right atrial pressure of 15 mmHg. IAS/Shunts: No atrial level shunt detected by color flow Doppler.  LEFT VENTRICLE PLAX 2D LVIDd:         4.70 cm      Diastology LVIDs:         3.30 cm      LV e' medial:    12.70 cm/s LV PW:         0.70 cm      LV E/e' medial:  8.5 LV IVS:        0.80 cm      LV e' lateral:   12.00 cm/s LVOT diam:     2.00 cm      LV E/e' lateral: 9.0 LV SV:         50 LV SV Index:   25 LVOT Area:     3.14 cm  LV Volumes (MOD) LV vol d, MOD A2C: 116.0 ml LV vol d, MOD A4C: 137.0 ml LV vol s, MOD A2C: 41.3 ml LV vol s, MOD A4C: 59.0 ml LV SV MOD A2C:     74.7 ml LV SV MOD A4C:     137.0 ml LV SV MOD BP:      77.8 ml RIGHT VENTRICLE             IVC RV Basal diam:  3.00 cm     IVC diam: 2.10 cm RV Mid diam:    1.90 cm RV S prime:     10.10 cm/s TAPSE (M-mode): 2.9 cm LEFT ATRIUM           Index        RIGHT ATRIUM           Index LA diam:      4.50 cm 2.30 cm/m   RA Area:     11.90 cm LA Vol (A2C): 41.5 ml 21.18 ml/m  RA Volume:   24.00 ml  12.25 ml/m LA Vol (A4C): 61.4 ml 31.34 ml/m  AORTIC VALVE                    PULMONIC VALVE AV Area (Vmax):    2.43 cm     PV Vmax:       0.65 m/s AV Area (Vmean):   2.42 cm     PV Peak grad:  1.7 mmHg AV Area (VTI):     2.96 cm AV Vmax:           110.53 cm/s AV Vmean:          78.433 cm/s AV VTI:            0.168 m AV Peak Grad:      4.9 mmHg AV Mean Grad:      3.0 mmHg LVOT Vmax:         85.40 cm/s LVOT Vmean:        60.500 cm/s LVOT VTI:          0.158 m LVOT/AV VTI ratio: 0.94  AORTA Ao Root diam: 3.30 cm Ao Asc diam:  3.00 cm MITRAL VALVE MV Area  (PHT): 4.01 cm     SHUNTS MV Area VTI:   1.80 cm     Systemic VTI:  0.16 m MV Peak grad:  5.9 mmHg     Systemic Diam:  2.00 cm MV Mean grad:  2.0 mmHg MV Vmax:       1.21 m/s MV Vmean:      69.7 cm/s MV Decel Time: 189 msec MV E velocity: 108.00 cm/s Dalton McleanMD Electronically signed by Archer Bear Signature Date/Time: 11/29/2023/7:39:26 PM    Final    VAS US  LOWER EXTREMITY VENOUS (DVT) (7a-7p) Result Date: 11/29/2023  Lower Venous DVT Study Patient Name:  [name]  Date of Exam:   11/28/2023 Medical Rec #: [id number]            Accession #:    [id number] Date of Birth: 1955/08/26           Patient Gender: F Patient Age:   14 years Exam Location:  [facility] Procedure:      VAS US  LOWER EXTREMITY VENOUS (DVT) Referring Phys: AMJAD ALI --------------------------------------------------------------------------------  Indications: Swelling, and Dizziness, hypotension. Aflutter with RVR Other Indications: New diagnosis of Aflutter by primary care, prescribed Eliquis                    but delayed starting it. Risk Factors: Aflutter. Comparison Study: No prior study on file Performing Technologist: Carleene Chase RVS  Examination Guidelines: A complete evaluation includes B-mode imaging, spectral Doppler, color Doppler, and power Doppler as needed of all accessible portions of each vessel. Bilateral testing is considered an integral part of a complete examination. Limited examinations for reoccurring indications may be performed as noted. The reflux portion of the exam is performed with the patient in reverse Trendelenburg.  +-----+---------------+---------+-----------+----------+--------------+ RIGHTCompressibilityPhasicitySpontaneityPropertiesThrombus Aging +-----+---------------+---------+-----------+----------+--------------+ CFV  Full           Yes      Yes                                 +-----+---------------+---------+-----------+----------+--------------+    +---------+---------------+---------+-----------+----------+--------------+ LEFT     CompressibilityPhasicitySpontaneityPropertiesThrombus Aging +---------+---------------+---------+-----------+----------+--------------+ CFV      Full           Yes      Yes                                 +---------+---------------+---------+-----------+----------+--------------+ SFJ      Full                                                        +---------+---------------+---------+-----------+----------+--------------+ FV Prox  Full                                                        +---------+---------------+---------+-----------+----------+--------------+ FV Mid   Full                                                        +---------+---------------+---------+-----------+----------+--------------+ FV DistalFull                                                        +---------+---------------+---------+-----------+----------+--------------+  PFV      Full                                                        +---------+---------------+---------+-----------+----------+--------------+ POP      Full           Yes      Yes                                 +---------+---------------+---------+-----------+----------+--------------+ PTV      Partial                                      Acute          +---------+---------------+---------+-----------+----------+--------------+ PERO     Partial                                      Acute          +---------+---------------+---------+-----------+----------+--------------+    Summary: RIGHT: - No evidence of common femoral vein obstruction.   LEFT: - Findings consistent with acute deep vein thrombosis involving the proximal segments of the left posterior tibial veins, and left peroneal veins.   *See table(s) above for measurements and observations. Electronically signed by Genny Kid MD on 11/29/2023 at 3:43:02 PM.     Final    CT Angio Chest PE W and/or Wo Contrast Result Date: 11/28/2023 CLINICAL DATA:  Pulmonary embolism (PE) suspected, high prob Syncope. EXAM: CT ANGIOGRAPHY CHEST WITH CONTRAST TECHNIQUE: Multidetector CT imaging of the chest was performed using the standard protocol during bolus administration of intravenous contrast. Multiplanar CT image reconstructions and MIPs were obtained to evaluate the vascular anatomy. RADIATION DOSE REDUCTION: This exam was performed according to the departmental dose-optimization program which includes automated exposure control, adjustment of the mA and/or kV according to patient size and/or use of iterative reconstruction technique. CONTRAST:  75mL OMNIPAQUE IOHEXOL 350 MG/ML SOLN COMPARISON:  Radiograph earlier today FINDINGS: Cardiovascular: There are no filling defects within the pulmonary arteries to suggest pulmonary embolus. The heart is normal in size. No pericardial effusion. Trace atherosclerosis of the thoracic aorta. No aneurysm. Mediastinum/Nodes: Prominent left hilar nodes measuring up to 10 mm, likely reactive. There also prominent prevascular nodes measuring up to 8 mm short axis. Calcified left thyroid nodule measuring 11 mm in the left lobe. Not clinically significant; no follow-up imaging recommended (ref: J Am Coll Radiol. 2015 Feb;12(2): 143-50). Unremarkable esophagus. Lungs/Pleura: Patchy ground-glass CP and nodular opacities in the left upper and to a lesser extent lower lobe typical of pneumonia. There is also minimal dependent opacity in the right lower lobe. Mild central bronchial thickening. No pleural fluid. Trachea and central airways are clear. Upper Abdomen: No acute findings.  Cholecystectomy. Musculoskeletal: There are no acute or suspicious osseous abnormalities. Thoracic spondylosis. Review of the MIP images confirms the above findings. IMPRESSION: 1. No pulmonary embolus. 2. Patchy ground-glass the and nodular opacities in the left upper and  to a lesser extent lower lobe typical of pneumonia. 3. Prominent left hilar and prevascular nodes, likely reactive. Aortic Atherosclerosis (ICD10-I70.0). Electronically Signed  By: Chadwick Colonel M.D.   On: 11/28/2023 19:34   DG Chest Portable 1 View Result Date: 11/28/2023 CLINICAL DATA:  Syncope EXAM: PORTABLE CHEST 1 VIEW COMPARISON:  Chest radiograph dated 01/11/2011 FINDINGS: Normal lung volumes. Multifocal patchy left lung opacities. No pleural effusion or pneumothorax. The heart size and mediastinal contours are within normal limits. No acute osseous abnormality. IMPRESSION: Multifocal patchy left lung opacities, which may represent multifocal aspiration or pneumonia. Electronically Signed   By: Limin  Xu M.D.   On: 11/28/2023 13:49   Cardiac Studies   Echocardiogram, 11/28/2023 Left ventricular ejection fraction, by estimation, is 40 to 45% . The left ventricle has mildly decreased function. The left ventricle demonstrates global hypokinesis. Left ventricular diastolic parameters are indeterminate.  Right ventricular systolic function is mildly reduced. The right ventricular size is normal. Tricuspid regurgitation signal is inadequate for assessing PA pressure.  Left atrial size was mildly dilated.  The mitral valve is normal in structure. Trivial mitral valve regurgitation. No evidence of mitral stenosis. Moderate mitral annular calcification.  The aortic valve is tricuspid. There is mild calcification of the aortic valve. Aortic valve regurgitation is not visualized. No aortic stenosis is present.  The inferior vena cava is dilated in size with < 50% respiratory variability, suggesting right atrial pressure of 15 mmHg.  The patient was in atrial flutter.  Technically difficult study with poor acoustic windows.  Patient Profile     68 y.o. female with a hx of atrial flutter who is being seen for the atrial flutter   Assessment & Plan    Atrial flutter with RVR  Echo this admission  showed: EF 40-45%, mildly reduced RV function, mildly dilated LA HR currently in 80s, remains in atrial flutter BP stable  Discussed TEE guided cardioversion with patient -- she is open to the idea, wants to wait for her husband to arrive to discuss with him for final decision  Placed on schedule for TEE guided DCCV tomorrow 5/14  NPO at midnight  Continue on Lopressor 12.5 mg BID  Continue on Eliquis per DVT protocol -- 10 mg BID followed by 5 mg BID   Per primary  Atypical PNA Hypotension Acute DVT of LLE Hypokalemia  Anemia     For questions or updates, please contact [facility] Please consult [email or web address] for contact info under        Signed, Jiles Mote, PA-C  11/30/2023, 9:10 AM

## 2023-11-30 NOTE — Progress Notes (Signed)
 Patient is febrile with a temperature of 101.63F.

## 2023-12-01 ENCOUNTER — Inpatient Hospital Stay (HOSPITAL_COMMUNITY): Payer: Self-pay

## 2023-12-01 ENCOUNTER — Encounter (HOSPITAL_COMMUNITY): Payer: Self-pay | Admitting: Internal Medicine

## 2023-12-01 ENCOUNTER — Encounter (HOSPITAL_COMMUNITY)
Admission: EM | Disposition: A | Discharge status: Home / Self Care | Payer: Self-pay | Source: Home / Self Care | Attending: Internal Medicine

## 2023-12-01 ENCOUNTER — Inpatient Hospital Stay (HOSPITAL_COMMUNITY)

## 2023-12-01 DIAGNOSIS — R7989 Other specified abnormal findings of blood chemistry: Secondary | ICD-10-CM | POA: Diagnosis not present

## 2023-12-01 DIAGNOSIS — I5041 Acute combined systolic (congestive) and diastolic (congestive) heart failure: Secondary | ICD-10-CM | POA: Diagnosis not present

## 2023-12-01 DIAGNOSIS — I10 Essential (primary) hypertension: Secondary | ICD-10-CM

## 2023-12-01 DIAGNOSIS — I340 Nonrheumatic mitral (valve) insufficiency: Secondary | ICD-10-CM | POA: Diagnosis not present

## 2023-12-01 DIAGNOSIS — I4892 Unspecified atrial flutter: Secondary | ICD-10-CM

## 2023-12-01 DIAGNOSIS — I4891 Unspecified atrial fibrillation: Secondary | ICD-10-CM | POA: Diagnosis not present

## 2023-12-01 HISTORY — PX: TRANSESOPHAGEAL ECHOCARDIOGRAM (CATH LAB): EP1270

## 2023-12-01 HISTORY — PX: CARDIOVERSION: EP1203

## 2023-12-01 LAB — BASIC METABOLIC PANEL WITH GFR
Anion gap: 9 (ref 5–15)
BUN: 8 mg/dL (ref 8–23)
CO2: 24 mmol/L (ref 22–32)
Calcium: 9 mg/dL (ref 8.9–10.3)
Chloride: 105 mmol/L (ref 98–111)
Creatinine, Ser: 0.62 mg/dL (ref 0.44–1.00)
GFR, Estimated: >60 mL/min (ref >60)
Glucose, Bld: 106 mg/dL — ABNORMAL HIGH (ref 70–99)
Potassium: 3.8 mmol/L (ref 3.5–5.1)
Sodium: 138 mmol/L (ref 135–145)

## 2023-12-01 LAB — CBC
HCT: 37 % (ref 36.0–46.0)
Hemoglobin: 12.6 g/dL (ref 12.0–15.0)
MCH: 26.8 pg (ref 26.0–34.0)
MCHC: 34.1 g/dL (ref 30.0–36.0)
MCV: 78.7 fL — ABNORMAL LOW (ref 80.0–100.0)
Platelets: 329 10*3/uL (ref 150–400)
RBC: 4.7 MIL/uL (ref 3.87–5.11)
RDW: 15 % (ref 11.5–15.5)
WBC: 4.6 10*3/uL (ref 4.0–10.5)
nRBC: 0 % (ref 0.0–0.2)

## 2023-12-01 LAB — RESPIRATORY PANEL BY PCR

## 2023-12-01 LAB — MRSA NEXT GEN BY PCR, NASAL: MRSA by PCR Next Gen: NOT DETECTED

## 2023-12-01 LAB — ECHO TEE

## 2023-12-01 LAB — MAGNESIUM: Magnesium: 1.8 mg/dL (ref 1.7–2.4)

## 2023-12-01 SURGERY — TRANSESOPHAGEAL ECHOCARDIOGRAM (TEE) (CATHLAB)
Anesthesia: Monitor Anesthesia Care

## 2023-12-01 MED ORDER — PROPOFOL 10 MG/ML IV BOLUS
INTRAVENOUS | Status: DC | PRN
  Administered 2023-12-01 (×2): 20 mg via INTRAVENOUS
  Administered 2023-12-01: 30 mg via INTRAVENOUS
  Administered 2023-12-01: 150 ug/kg/min via INTRAVENOUS

## 2023-12-01 MED ORDER — PHENYLEPHRINE HCL (PRESSORS) 10 MG/ML IV SOLN
INTRAVENOUS | Status: DC | PRN
Start: 2023-12-01 — End: 2023-12-01
  Administered 2023-12-01: 160 ug via INTRAVENOUS

## 2023-12-01 MED ORDER — GUAIFENESIN ER 600 MG PO TB12
1200.0000 mg | ORAL_TABLET | Freq: Two times a day (BID) | ORAL | Status: DC
  Administered 2023-12-01 – 2023-12-02 (×2): 1200 mg via ORAL
  Filled 2023-12-01 (×3): qty 2

## 2023-12-01 MED ORDER — BENZONATATE 100 MG PO CAPS
100.0000 mg | ORAL_CAPSULE | Freq: Two times a day (BID) | ORAL | Status: DC | PRN
Start: 2023-12-01 — End: 2023-12-02

## 2023-12-01 MED ORDER — DAPAGLIFLOZIN PROPANEDIOL 10 MG PO TABS
10.0000 mg | ORAL_TABLET | Freq: Every day | ORAL | Status: DC
  Administered 2023-12-01 – 2023-12-02 (×2): 10 mg via ORAL
  Filled 2023-12-01 (×3): qty 1

## 2023-12-01 MED ORDER — SODIUM CHLORIDE 0.9 % IV SOLN: INTRAVENOUS | Status: DC | PRN

## 2023-12-01 SURGICAL SUPPLY — 1 items: PAD DEFIB RADIO PHYSIO CONN (PAD) ×1 IMPLANT

## 2023-12-01 NOTE — Transfer of Care (Signed)
 Immediate Anesthesia Transfer of Care Note  Patient: [name]  Procedure(s) Performed: TRANSESOPHAGEAL ECHOCARDIOGRAM CARDIOVERSION  Patient Location: PACU and Cath Lab  Anesthesia Type:MAC  Level of Consciousness: awake and alert   Airway & Oxygen Therapy: Patient Spontanous Breathing and Patient connected to nasal cannula oxygen  Post-op Assessment: Report given to RN and Post -op Vital signs reviewed and stable  Post vital signs: Reviewed and stable  Last Vitals:  Vitals Value Taken Time  BP 106/71 12/01/23 1336  Temp 36.7 C 12/01/23 1329  Pulse 52 12/01/23 1336  Resp 20 12/01/23 1336  SpO2 97 % 12/01/23 1336    Last Pain:  Vitals:   12/01/23 1329  TempSrc: Tympanic  PainSc: Asleep         Complications: No notable events documented.

## 2023-12-01 NOTE — Progress Notes (Signed)
 PROGRESS NOTE    [name]  ZOX:[id number] DOB: 05-13-1956 DOA: 11/28/2023 PCP: Patient, No Pcp Per   Brief Narrative: 68 year old with past medical history significant for paroxysmal A- for presented to the ED after near syncope episode at church when she began feeling lightheaded, no loss of consciousness.  CTA chest was negative for PE, did show patchy groundglass opacity in the left and lower lobe segment suspicious for pneumonia and hilar lymphadenopathy.  Spike fever overnight.  Assessment & Plan:   Principal Problem:   Paroxysmal atrial flutter (HCC) Active Problems:   Near syncope   Pneumonia due to infectious organism   Acute deep vein thrombosis (DVT) of tibial vein of left lower extremity (HCC)   Hypokalemia   Acute heart failure with mildly reduced ejection fraction (HFmrEF, 41-49%) (HCC)   AKI (acute kidney injury) (HCC)   1-Syncope -Received IV fluids. In setting PNA and A flutter.   Paroxysmal a flutter; Cardiology following -Underwent  TEE cardioversion.   Acute systolic and diastolic heart failure: -ECHO EF 40 --45 % -Cardiology following. -Received 2 doses IV lasix.   Left upper lobe opacity question pneumonia versus aspiration pneumonia -Continue IV Unasyn and doxycycline COVID and RSV negative Like fever overnight 5/13 Check blood cultures Complete resp panel ordered. MRSA Negative  Hypotension;  -Thought to be related to dehydration, initially, received IV fluids. Also PNA>   Acute DVT of tibial vein and left lower extremity -On Eliquis  Hypokalemia -Replaced.  Normocytic anemia -Monitor hb.    Estimated body mass index is 34.31 kg/m as calculated from the following:   Height as of this encounter: 5\' 4"  (1.626 m).   Weight as of this encounter: 90.7 kg.   DVT prophylaxis: Eliquis Code Status: Full code Family Communication: care discussed with patient. Disposition Plan:  Status is: Inpatient Remains inpatient  appropriate because: management of PNA    Consultants:  Cardiology   Procedures:  cardioversion  Antimicrobials:    Subjective: She is alert, feels better,. Just came from cardioversion.   Objective: Vitals:   11/30/23 2125 11/30/23 2235 12/01/23 0033 12/01/23 0441  BP: 130/79  115/81 133/85  Pulse: (!) 113  (!) 106 (!) 105  Resp:   18 18  Temp:  99 F (37.2 C) 98.2 F (36.8 C) 99.7 F (37.6 C)  TempSrc:  Oral Oral Oral  SpO2:   98% 97%  Weight:    90.7 kg  Height:        Intake/Output Summary (Last 24 hours) at 12/01/2023 0729 Last data filed at 12/01/2023 0700 Gross per 24 hour  Intake 1600 ml  Output 1175 ml  Net 425 ml   Filed Weights   11/28/23 1211 11/30/23 0500 12/01/23 0441  Weight: 91 kg 92.9 kg 90.7 kg    Examination:  General exam: Appears calm and comfortable  Respiratory system: Clear to auscultation. Respiratory effort normal. Cardiovascular system: S1 & S2 heard, RRR. No JVD, murmurs, rubs, gallops or clicks. No pedal edema. Gastrointestinal system: Abdomen is nondistended, soft and nontender. No organomegaly or masses felt. Normal bowel sounds heard. Central nervous system: Alert and oriented. No focal neurological deficits. Extremities: Symmetric 5 x 5 power.    Data Reviewed: I have personally reviewed following labs and imaging studies  CBC: Recent Labs  Lab 11/28/23 1211 11/29/23 0508  WBC 4.3 4.3  NEUTROABS 2.6  --   HGB 12.2 10.7*  HCT 38.0 32.1*  MCV 85.0 82.3  PLT 362 284   Basic Metabolic  Panel: Recent Labs  Lab 11/28/23 1211 11/28/23 2214 11/29/23 0508  NA 141  --  140  K 3.4*  --  4.0  CL 107  --  109  CO2 20*  --  22  GLUCOSE 104*  --  87  BUN 21  --  8  CREATININE 0.88  --  0.54  CALCIUM 9.2  --  8.8*  MG  --  1.7  --    GFR: Estimated Creatinine Clearance: 76.5 mL/min (by C-G formula based on SCr of 0.54 mg/dL). Liver Function Tests: No results for input(s): "AST", "ALT", "ALKPHOS", "BILITOT", "PROT",  "ALBUMIN" in the last 168 hours. No results for input(s): "LIPASE", "AMYLASE" in the last 168 hours. No results for input(s): "AMMONIA" in the last 168 hours. Coagulation Profile: No results for input(s): "INR", "PROTIME" in the last 168 hours. Cardiac Enzymes: No results for input(s): "CKTOTAL", "CKMB", "CKMBINDEX", "TROPONINI" in the last 168 hours. BNP (last 3 results) No results for input(s): "PROBNP" in the last 8760 hours. HbA1C: No results for input(s): "HGBA1C" in the last 72 hours. CBG: No results for input(s): "GLUCAP" in the last 168 hours. Lipid Profile: No results for input(s): "CHOL", "HDL", "LDLCALC", "TRIG", "CHOLHDL", "LDLDIRECT" in the last 72 hours. Thyroid Function Tests: No results for input(s): "TSH", "T4TOTAL", "FREET4", "T3FREE", "THYROIDAB" in the last 72 hours. Anemia Panel: No results for input(s): "VITAMINB12", "FOLATE", "FERRITIN", "TIBC", "IRON", "RETICCTPCT" in the last 72 hours. Sepsis Labs: No results for input(s): "PROCALCITON", "LATICACIDVEN" in the last 168 hours.  Recent Results (from the past 240 hours)  Resp panel by RT-PCR (RSV, Flu A&B, Covid) Anterior Nasal Swab     Status: None   Collection Time: 11/28/23 10:01 PM   Specimen: Anterior Nasal Swab  Result Value Ref Range Status   SARS Coronavirus 2 by RT PCR NEGATIVE NEGATIVE Final   Influenza A by PCR NEGATIVE NEGATIVE Final   Influenza B by PCR NEGATIVE NEGATIVE Final    Comment: (NOTE) The Xpert Xpress SARS-CoV-2/FLU/RSV plus assay is intended as an aid in the diagnosis of influenza from Nasopharyngeal swab specimens and should not be used as a sole basis for treatment. Nasal washings and aspirates are unacceptable for Xpert Xpress SARS-CoV-2/FLU/RSV testing.  Fact Sheet for Patients: BloggerCourse.com  Fact Sheet for Healthcare Providers: SeriousBroker.it  This test is not yet approved or cleared by the [place]  FDA and has  been authorized for detection and/or diagnosis of SARS-CoV-2 by FDA under an Emergency Use Authorization (EUA). This EUA will remain in effect (meaning this test can be used) for the duration of the COVID-19 declaration under Section 564(b)(1) of the Act, 21 U.S.C. section 360bbb-3(b)(1), unless the authorization is terminated or revoked.     Resp Syncytial Virus by PCR NEGATIVE NEGATIVE Final    Comment: (NOTE) Fact Sheet for Patients: BloggerCourse.com  Fact Sheet for Healthcare Providers: SeriousBroker.it  This test is not yet approved or cleared by the [place]  FDA and has been authorized for detection and/or diagnosis of SARS-CoV-2 by FDA under an Emergency Use Authorization (EUA). This EUA will remain in effect (meaning this test can be used) for the duration of the COVID-19 declaration under Section 564(b)(1) of the Act, 21 U.S.C. section 360bbb-3(b)(1), unless the authorization is terminated or revoked.  Performed at [facility], [address], [place], [place] [address]          Radiology Studies: ECHOCARDIOGRAM COMPLETE Result Date: 11/29/2023    ECHOCARDIOGRAM REPORT   Patient Name:  Kindal COLLEEN Golembeski Date of Exam: 11/29/2023 Medical Rec #:  [id number]           Height:       64.0 in Accession #:    [id number]          Weight:       200.6 lb Date of Birth:  1956/04/07          BSA:          1.959 m Patient Age:    67 years            BP:           121/86 mmHg Patient Gender: F                   HR:           97 bpm. Exam Location:  Inpatient Procedure: 2D Echo, Color Doppler and Cardiac Doppler (Both Spectral and Color            Flow Doppler were utilized during procedure). Indications:    Atrial flutter  History:        Patient has no prior history of Echocardiogram examinations.                 DVT.  Sonographer:    Juanita Shaw Referring Phys: [id number] VISHAL R PATEL IMPRESSIONS  1. Left ventricular  ejection fraction, by estimation, is 40 to 45%. The left ventricle has mildly decreased function. The left ventricle demonstrates global hypokinesis. Left ventricular diastolic parameters are indeterminate.  2. Right ventricular systolic function is mildly reduced. The right ventricular size is normal. Tricuspid regurgitation signal is inadequate for assessing PA pressure.  3. Left atrial size was mildly dilated.  4. The mitral valve is normal in structure. Trivial mitral valve regurgitation. No evidence of mitral stenosis. Moderate mitral annular calcification.  5. The aortic valve is tricuspid. There is mild calcification of the aortic valve. Aortic valve regurgitation is not visualized. No aortic stenosis is present.  6. The inferior vena cava is dilated in size with <50% respiratory variability, suggesting right atrial pressure of 15 mmHg.  7. The patient was in atrial flutter.  8. Technically difficult study with poor acoustic windows. FINDINGS  Left Ventricle: Left ventricular ejection fraction, by estimation, is 40 to 45%. The left ventricle has mildly decreased function. The left ventricle demonstrates global hypokinesis. The left ventricular internal cavity size was normal in size. There is  no left ventricular hypertrophy. Left ventricular diastolic parameters are indeterminate. Right Ventricle: The right ventricular size is normal. No increase in right ventricular wall thickness. Right ventricular systolic function is mildly reduced. Tricuspid regurgitation signal is inadequate for assessing PA pressure. Left Atrium: Left atrial size was mildly dilated. Right Atrium: Right atrial size was normal in size. Pericardium: There is no evidence of pericardial effusion. Mitral Valve: The mitral valve is normal in structure. Moderate mitral annular calcification. Trivial mitral valve regurgitation. No evidence of mitral valve stenosis. MV peak gradient, 5.9 mmHg. The mean mitral valve gradient is 2.0 mmHg.  Tricuspid Valve: The tricuspid valve is normal in structure. Tricuspid valve regurgitation is trivial. Aortic Valve: The aortic valve is tricuspid. There is mild calcification of the aortic valve. Aortic valve regurgitation is not visualized. No aortic stenosis is present. Aortic valve mean gradient measures 3.0 mmHg. Aortic valve peak gradient measures 4.9 mmHg. Aortic valve area, by VTI measures 2.96 cm. Pulmonic Valve: The pulmonic valve was normal in structure. Pulmonic valve regurgitation  is trivial. Aorta: The aortic root is normal in size and structure. Venous: The inferior vena cava is dilated in size with less than 50% respiratory variability, suggesting right atrial pressure of 15 mmHg. IAS/Shunts: No atrial level shunt detected by color flow Doppler.  LEFT VENTRICLE PLAX 2D LVIDd:         4.70 cm      Diastology LVIDs:         3.30 cm      LV e' medial:    12.70 cm/s LV PW:         0.70 cm      LV E/e' medial:  8.5 LV IVS:        0.80 cm      LV e' lateral:   12.00 cm/s LVOT diam:     2.00 cm      LV E/e' lateral: 9.0 LV SV:         50 LV SV Index:   25 LVOT Area:     3.14 cm  LV Volumes (MOD) LV vol d, MOD A2C: 116.0 ml LV vol d, MOD A4C: 137.0 ml LV vol s, MOD A2C: 41.3 ml LV vol s, MOD A4C: 59.0 ml LV SV MOD A2C:     74.7 ml LV SV MOD A4C:     137.0 ml LV SV MOD BP:      77.8 ml RIGHT VENTRICLE             IVC RV Basal diam:  3.00 cm     IVC diam: 2.10 cm RV Mid diam:    1.90 cm RV S prime:     10.10 cm/s TAPSE (M-mode): 2.9 cm LEFT ATRIUM           Index        RIGHT ATRIUM           Index LA diam:      4.50 cm 2.30 cm/m   RA Area:     11.90 cm LA Vol (A2C): 41.5 ml 21.18 ml/m  RA Volume:   24.00 ml  12.25 ml/m LA Vol (A4C): 61.4 ml 31.34 ml/m  AORTIC VALVE                    PULMONIC VALVE AV Area (Vmax):    2.43 cm     PV Vmax:       0.65 m/s AV Area (Vmean):   2.42 cm     PV Peak grad:  1.7 mmHg AV Area (VTI):     2.96 cm AV Vmax:           110.53 cm/s AV Vmean:          78.433 cm/s AV  VTI:            0.168 m AV Peak Grad:      4.9 mmHg AV Mean Grad:      3.0 mmHg LVOT Vmax:         85.40 cm/s LVOT Vmean:        60.500 cm/s LVOT VTI:          0.158 m LVOT/AV VTI ratio: 0.94  AORTA Ao Root diam: 3.30 cm Ao Asc diam:  3.00 cm MITRAL VALVE MV Area (PHT): 4.01 cm     SHUNTS MV Area VTI:   1.80 cm     Systemic VTI:  0.16 m MV Peak grad:  5.9 mmHg     Systemic Diam: 2.00 cm MV Mean  grad:  2.0 mmHg MV Vmax:       1.21 m/s MV Vmean:      69.7 cm/s MV Decel Time: 189 msec MV E velocity: 108.00 cm/s Dalton McleanMD Electronically signed by Archer Bear Signature Date/Time: 11/29/2023/7:39:26 PM    Final         Scheduled Meds:  apixaban  10 mg Oral BID   Followed by   Cecily Cohen ON 12/05/2023] apixaban  5 mg Oral BID   doxycycline  100 mg Oral Q12H   metoprolol tartrate  12.5 mg Oral BID   sodium chloride flush  3 mL Intravenous Q12H   sodium chloride flush  3-10 mL Intravenous Q12H   Continuous Infusions:  ampicillin-sulbactam (UNASYN) IV 1.5 g (12/01/23 0402)     LOS: 1 day    Time spent: 35 minutes    Makinze Jani A Escarlet Saathoff, MD [facility]   If 7PM-7AM, please contact night-coverage [email or web address]  12/01/2023, 7:29 AM

## 2023-12-01 NOTE — Plan of Care (Signed)

## 2023-12-01 NOTE — Anesthesia Preprocedure Evaluation (Addendum)
 Anesthesia Evaluation  Patient identified by MRN, date of birth, ID band Patient awake    Reviewed: Allergy & Precautions, NPO status , Patient's Chart, lab work & pertinent test results  History of Anesthesia Complications Negative for: history of anesthetic complications  Airway Mallampati: I  TM Distance: >3 FB Neck ROM: Full    Dental  (+) Dental Advisory Given   Pulmonary former smoker   breath sounds clear to auscultation       Cardiovascular hypertension, Pt. on medications (-) angina + DVT  + dysrhythmias Atrial Fibrillation  Rhythm:Irregular Rate:Normal  11/29/2023 ECHO: EF 40 to 45%.  1.The LV has mildly decreased function, global hypokinesis.   2. RVF is mildly reduced. The right ventricular size is normal. Tricuspid regurgitation signal is inadequate for assessing PA pressure.  3. Left atrial size was mildly dilated.  4. The mitral valve is normal in structure. Trivial mitral valve regurgitation. No evidence of mitral stenosis. Moderate mitral annular calcification.  5. The aortic valve is tricuspid. There is mild calcification of the aortic valve. Aortic valve regurgitation is not visualized. No aortic stenosis is present.    Neuro/Psych negative neurological ROS     GI/Hepatic negative GI ROS, Neg liver ROS,,,  Endo/Other  BMI 34  Renal/GU Renal InsufficiencyRenal disease     Musculoskeletal   Abdominal   Peds  Hematology eliquis   Anesthesia Other Findings   Reproductive/Obstetrics                             Anesthesia Physical Anesthesia Plan  ASA: 3  Anesthesia Plan: MAC   Post-op Pain Management: Minimal or no pain anticipated   Induction:   PONV Risk Score and Plan: 2 and Treatment may vary due to age or medical condition  Airway Management Planned: Natural Airway and Simple Face Mask  Additional Equipment: None  Intra-op Plan:   Post-operative Plan:    Informed Consent: I have reviewed the patients History and Physical, chart, labs and discussed the procedure including the risks, benefits and alternatives for the proposed anesthesia with the patient or authorized representative who has indicated his/her understanding and acceptance.     Dental advisory given  Plan Discussed with: CRNA and Surgeon  Anesthesia Plan Comments:        Anesthesia Quick Evaluation

## 2023-12-01 NOTE — Anesthesia Postprocedure Evaluation (Signed)
 Anesthesia Post Note  Patient: [name]  Procedure(s) Performed: TRANSESOPHAGEAL ECHOCARDIOGRAM CARDIOVERSION     Patient location during evaluation: Cath Lab Anesthesia Type: MAC Level of consciousness: oriented, patient cooperative and awake and alert Pain management: pain level controlled Vital Signs Assessment: post-procedure vital signs reviewed and stable Respiratory status: respiratory function stable, nonlabored ventilation and spontaneous breathing Cardiovascular status: blood pressure returned to baseline and stable Postop Assessment: no apparent nausea or vomiting Anesthetic complications: no   No notable events documented.  Last Vitals:  Vitals:   12/01/23 1156 12/01/23 1329  BP: 125/79 93/69  Pulse: (!) 107 (!) 48  Resp: 17 20  Temp: 36.7 C 36.7 C  SpO2: 95% 100%    Last Pain:  Vitals:   12/01/23 1329  TempSrc: Tympanic  PainSc: Asleep                 Chala Gul,E. Paiden Caraveo

## 2023-12-01 NOTE — Progress Notes (Signed)
  Echocardiogram Echocardiogram Transesophageal has been performed.  Royden Corin 12/01/2023, 1:34 PM

## 2023-12-01 NOTE — Progress Notes (Signed)
 Rounding Note    Patient Name: [name] Date of Encounter: 12/01/2023  [facility] Cardiologist: None   Subjective   Discussed with patient her TEE/DCCV that is scheduled today All questions were answered and she is willing to proceed with the procedure today She denies feeling poorly, no symptoms besides a cough  Inpatient Medications    Scheduled Meds:  apixaban  10 mg Oral BID   Followed by   Cecily Cohen ON 12/05/2023] apixaban  5 mg Oral BID   doxycycline  100 mg Oral Q12H   metoprolol tartrate  12.5 mg Oral BID   sodium chloride flush  3 mL Intravenous Q12H   sodium chloride flush  3-10 mL Intravenous Q12H   Continuous Infusions:  ampicillin-sulbactam (UNASYN) IV 1.5 g (12/01/23 0402)   PRN Meds: acetaminophen **OR** acetaminophen, bisacodyl, guaiFENesin, menthol-cetylpyridinium, metoprolol tartrate, ondansetron (ZOFRAN) IV, senna-docusate, sodium chloride flush   Vital Signs    Vitals:   11/30/23 2125 11/30/23 2235 12/01/23 0033 12/01/23 0441  BP: 130/79  115/81 133/85  Pulse: (!) 113  (!) 106 (!) 105  Resp:   18 18  Temp:  99 F (37.2 C) 98.2 F (36.8 C) 99.7 F (37.6 C)  TempSrc:  Oral Oral Oral  SpO2:   98% 97%  Weight:    90.7 kg  Height:        Intake/Output Summary (Last 24 hours) at 12/01/2023 0832 Last data filed at 12/01/2023 0700 Gross per 24 hour  Intake 1240 ml  Output 1175 ml  Net 65 ml      12/01/2023    4:41 AM 11/30/2023    5:00 AM 11/28/2023   12:11 PM  Last 3 Weights  Weight (lbs) 199 lb 14.4 oz 204 lb 11.2 oz 200 lb 9.9 oz  Weight (kg) 90.674 kg 92.851 kg 91 kg      Telemetry    Atrial flutter, HR 110s - Personally Reviewed  Physical Exam  GEN: No acute distress.   Neck: No JVD Cardiac: irregular rhythm, no murmurs, rubs, or gallops.  Respiratory: Clear to auscultation bilaterally. GI: Soft, nontender, non-distended  MS: No edema; No deformity. Neuro:  Nonfocal  Psych: Normal affect   Labs    High  Sensitivity Troponin:   Recent Labs  Lab 11/28/23 1211 11/28/23 1645 11/28/23 2214  TROPONINIHS 24* 69* 42*     Chemistry Recent Labs  Lab 11/28/23 1211 11/28/23 2214 11/29/23 0508  NA 141  --  140  K 3.4*  --  4.0  CL 107  --  109  CO2 20*  --  22  GLUCOSE 104*  --  87  BUN 21  --  8  CREATININE 0.88  --  0.54  CALCIUM 9.2  --  8.8*  MG  --  1.7  --   GFRNONAA >60  --  >60  ANIONGAP 14  --  9    Lipids No results for input(s): "CHOL", "TRIG", "HDL", "LABVLDL", "LDLCALC", "CHOLHDL" in the last 168 hours.  Hematology Recent Labs  Lab 11/28/23 1211 11/29/23 0508  WBC 4.3 4.3  RBC 4.47 3.90  HGB 12.2 10.7*  HCT 38.0 32.1*  MCV 85.0 82.3  MCH 27.3 27.4  MCHC 32.1 33.3  RDW 15.4 15.4  PLT 362 284   Thyroid No results for input(s): "TSH", "FREET4" in the last 168 hours.  BNPNo results for input(s): "BNP", "PROBNP" in the last 168 hours.  DDimer No results for input(s): "DDIMER" in the last  168 hours.   Radiology    ECHOCARDIOGRAM COMPLETE Result Date: 11/29/2023    ECHOCARDIOGRAM REPORT   Patient Name:   [name] Date of Exam: 11/29/2023 Medical Rec #:  [id number]           Height:       64.0 in Accession #:    [id number]          Weight:       200.6 lb Date of Birth:  March 27, 1956          BSA:          1.959 m Patient Age:    67 years            BP:           121/86 mmHg Patient Gender: F                   HR:           97 bpm. Exam Location:  Inpatient Procedure: 2D Echo, Color Doppler and Cardiac Doppler (Both Spectral and Color            Flow Doppler were utilized during procedure). Indications:    Atrial flutter  History:        Patient has no prior history of Echocardiogram examinations.                 DVT.  Sonographer:    Juanita Shaw Referring Phys: [id number] VISHAL R PATEL IMPRESSIONS  1. Left ventricular ejection fraction, by estimation, is 40 to 45%. The left ventricle has mildly decreased function. The left ventricle demonstrates global hypokinesis.  Left ventricular diastolic parameters are indeterminate.  2. Right ventricular systolic function is mildly reduced. The right ventricular size is normal. Tricuspid regurgitation signal is inadequate for assessing PA pressure.  3. Left atrial size was mildly dilated.  4. The mitral valve is normal in structure. Trivial mitral valve regurgitation. No evidence of mitral stenosis. Moderate mitral annular calcification.  5. The aortic valve is tricuspid. There is mild calcification of the aortic valve. Aortic valve regurgitation is not visualized. No aortic stenosis is present.  6. The inferior vena cava is dilated in size with <50% respiratory variability, suggesting right atrial pressure of 15 mmHg.  7. The patient was in atrial flutter.  8. Technically difficult study with poor acoustic windows. FINDINGS  Left Ventricle: Left ventricular ejection fraction, by estimation, is 40 to 45%. The left ventricle has mildly decreased function. The left ventricle demonstrates global hypokinesis. The left ventricular internal cavity size was normal in size. There is  no left ventricular hypertrophy. Left ventricular diastolic parameters are indeterminate. Right Ventricle: The right ventricular size is normal. No increase in right ventricular wall thickness. Right ventricular systolic function is mildly reduced. Tricuspid regurgitation signal is inadequate for assessing PA pressure. Left Atrium: Left atrial size was mildly dilated. Right Atrium: Right atrial size was normal in size. Pericardium: There is no evidence of pericardial effusion. Mitral Valve: The mitral valve is normal in structure. Moderate mitral annular calcification. Trivial mitral valve regurgitation. No evidence of mitral valve stenosis. MV peak gradient, 5.9 mmHg. The mean mitral valve gradient is 2.0 mmHg. Tricuspid Valve: The tricuspid valve is normal in structure. Tricuspid valve regurgitation is trivial. Aortic Valve: The aortic valve is tricuspid. There is  mild calcification of the aortic valve. Aortic valve regurgitation is not visualized. No aortic stenosis is present. Aortic valve mean gradient measures 3.0 mmHg. Aortic valve peak  gradient measures 4.9 mmHg. Aortic valve area, by VTI measures 2.96 cm. Pulmonic Valve: The pulmonic valve was normal in structure. Pulmonic valve regurgitation is trivial. Aorta: The aortic root is normal in size and structure. Venous: The inferior vena cava is dilated in size with less than 50% respiratory variability, suggesting right atrial pressure of 15 mmHg. IAS/Shunts: No atrial level shunt detected by color flow Doppler.  LEFT VENTRICLE PLAX 2D LVIDd:         4.70 cm      Diastology LVIDs:         3.30 cm      LV e' medial:    12.70 cm/s LV PW:         0.70 cm      LV E/e' medial:  8.5 LV IVS:        0.80 cm      LV e' lateral:   12.00 cm/s LVOT diam:     2.00 cm      LV E/e' lateral: 9.0 LV SV:         50 LV SV Index:   25 LVOT Area:     3.14 cm  LV Volumes (MOD) LV vol d, MOD A2C: 116.0 ml LV vol d, MOD A4C: 137.0 ml LV vol s, MOD A2C: 41.3 ml LV vol s, MOD A4C: 59.0 ml LV SV MOD A2C:     74.7 ml LV SV MOD A4C:     137.0 ml LV SV MOD BP:      77.8 ml RIGHT VENTRICLE             IVC RV Basal diam:  3.00 cm     IVC diam: 2.10 cm RV Mid diam:    1.90 cm RV S prime:     10.10 cm/s TAPSE (M-mode): 2.9 cm LEFT ATRIUM           Index        RIGHT ATRIUM           Index LA diam:      4.50 cm 2.30 cm/m   RA Area:     11.90 cm LA Vol (A2C): 41.5 ml 21.18 ml/m  RA Volume:   24.00 ml  12.25 ml/m LA Vol (A4C): 61.4 ml 31.34 ml/m  AORTIC VALVE                    PULMONIC VALVE AV Area (Vmax):    2.43 cm     PV Vmax:       0.65 m/s AV Area (Vmean):   2.42 cm     PV Peak grad:  1.7 mmHg AV Area (VTI):     2.96 cm AV Vmax:           110.53 cm/s AV Vmean:          78.433 cm/s AV VTI:            0.168 m AV Peak Grad:      4.9 mmHg AV Mean Grad:      3.0 mmHg LVOT Vmax:         85.40 cm/s LVOT Vmean:        60.500 cm/s LVOT VTI:           0.158 m LVOT/AV VTI ratio: 0.94  AORTA Ao Root diam: 3.30 cm Ao Asc diam:  3.00 cm MITRAL VALVE MV Area (PHT): 4.01 cm     SHUNTS MV Area VTI:   1.80 cm  Systemic VTI:  0.16 m MV Peak grad:  5.9 mmHg     Systemic Diam: 2.00 cm MV Mean grad:  2.0 mmHg MV Vmax:       1.21 m/s MV Vmean:      69.7 cm/s MV Decel Time: 189 msec MV E velocity: 108.00 cm/s Dalton McleanMD Electronically signed by Archer Bear Signature Date/Time: 11/29/2023/7:39:26 PM    Final     Cardiac Studies   Echocardiogram, 11/28/2023 Left ventricular ejection fraction, by estimation, is 40 to 45% . The left ventricle has mildly decreased function. The left ventricle demonstrates global hypokinesis. Left ventricular diastolic parameters are indeterminate.  Right ventricular systolic function is mildly reduced. The right ventricular size is normal. Tricuspid regurgitation signal is inadequate for assessing PA pressure.  Left atrial size was mildly dilated.  The mitral valve is normal in structure. Trivial mitral valve regurgitation. No evidence of mitral stenosis. Moderate mitral annular calcification.  The aortic valve is tricuspid. There is mild calcification of the aortic valve. Aortic valve regurgitation is not visualized. No aortic stenosis is present.  The inferior vena cava is dilated in size with < 50% respiratory variability, suggesting right atrial pressure of 15 mmHg.  The patient was in atrial flutter.  Technically difficult study with poor acoustic windows.  Patient Profile     68 y.o. female with history of paroxysmal atrial flutter, near syncope, who is being seen for atrial flutter  Assessment & Plan    Atrial flutter with RVR  Elevated troponin level  Acute systolic and diastolic CHF  Echo this admission showed: EF 40-45%, mildly reduced RV function, mildly dilated LA HR currently in 110s, remains in atrial flutter, has not gotten morning medications yet BP stable  Troponin levels 24 > 69 > 42, no  ischemic symptoms, likely due to demand ischemia Given IV Lasix 20 mg x 2 yesterday, urine output 1.1 L Waiting morning labs Scheduled for TEE guided DCCV today -- patient is willing to proceed  NPO since midnight  Continue on Lopressor 12.5 mg BID  Continue on Eliquis per DVT protocol -- 10 mg BID followed by 5 mg BID    Per primary  Atypical PNA Hypotension Acute DVT of LLE Hypokalemia  Anemia      For questions or updates, please contact [facility] Please consult [email or web address] for contact info under        Signed, Jiles Mote, PA-C  12/01/2023, 8:32 AM

## 2023-12-01 NOTE — Interval H&P Note (Signed)
 History and Physical Interval Note:  12/01/2023 12:00 PM  [name]  has presented today for surgery, with the diagnosis of aflutter.  The various methods of treatment have been discussed with the patient and family. After consideration of risks, benefits and other options for treatment, the patient has consented to  Procedure(s): TRANSESOPHAGEAL ECHOCARDIOGRAM (N/A) CARDIOVERSION (N/A) as a surgical intervention.  The patient's history has been reviewed, patient examined, no change in status, stable for surgery.  I have reviewed the patient's chart and labs.  Questions were answered to the patient's satisfaction.     Alvar Malinoski [name]

## 2023-12-01 NOTE — CV Procedure (Signed)
   TRANSESOPHAGEAL ECHOCARDIOGRAM GUIDED DIRECT CURRENT CARDIOVERSION  NAME:  [name]   MRN: [id number] DOB:  [birth year]   ADMIT DATE: 11/28/2023  INDICATIONS: Symptomatic atrial flutter  PROCEDURE:   Informed consent was obtained prior to the procedure. The risks, benefits and alternatives for the procedure were discussed and the patient comprehended these risks.  Risks include, but are not limited to, cough, sore throat, vomiting, nausea, somnolence, esophageal and stomach trauma or perforation, bleeding, low blood pressure, aspiration, pneumonia, infection, trauma to the teeth and death.    After a procedural time-out, the patient was sedated by the anesthesia service. The transesophageal probe was inserted in the esophagus and stomach without difficulty and multiple views were obtained. Anesthesia was monitored by Dr. Cleora Daft. Patient received 382.92 mg IV propofol.  COMPLICATIONS:    Complications: No complications Patient tolerated procedure well.  FINDINGS:  LEFT VENTRICLE: EF = 40-45% with global hypokinesis  RIGHT VENTRICLE: Normal size and mildly reduced function.   LEFT ATRIUM: No thrombus/mass. Moderately dilated.  LEFT ATRIAL APPENDAGE: No thrombus/mass.   RIGHT ATRIUM: No thrombus/mass.  AORTIC VALVE:  Trileaflet. Trivial regurgitation. No vegetation.  MITRAL VALVE:    Normal structure. Mild regurgitation. No vegetation.  TRICUSPID VALVE: Normal structure. Mild regurgitation. No vegetation.  PULMONIC VALVE: Grossly normal structure. Mild regurgitation. No apparent vegetation.  INTERATRIAL SEPTUM: No PFO or ASD seen by color Doppler.  PERICARDIUM: Trivial effusion noted.  DESCENDING AORTA: No signficant plaque seen   CARDIOVERSION:     Indications:  Symptomatic Atrial Flutter  Procedure Details:  Once the TEE was complete, the patient had the defibrillator pads placed in the anterior and posterior position. Once an appropriate level of  sedation was confirmed, the patient was cardioverted x 1 with 200J of biphasic synchronized energy.  The patient converted to NSR.  There were no apparent complications.  The patient had normal neuro status and respiratory status post procedure with vitals stable as recorded elsewhere.  Adequate airway was maintained throughout and vital signs monitored per protocol.  Sheryle Donning, MD, PhD, [facility]  [facility]  [place]  [facility] at [facility] at [facility] [address], [address] [place], [place] [address] [phone]   1:28 PM

## 2023-12-02 ENCOUNTER — Other Ambulatory Visit (HOSPITAL_COMMUNITY): Payer: Self-pay

## 2023-12-02 DIAGNOSIS — I4892 Unspecified atrial flutter: Secondary | ICD-10-CM | POA: Diagnosis not present

## 2023-12-02 DIAGNOSIS — I5041 Acute combined systolic (congestive) and diastolic (congestive) heart failure: Secondary | ICD-10-CM | POA: Diagnosis not present

## 2023-12-02 LAB — COMPREHENSIVE METABOLIC PANEL WITH GFR
ALT: 51 U/L — ABNORMAL HIGH (ref 0–44)
AST: 26 U/L (ref 15–41)
Albumin: 3.1 g/dL — ABNORMAL LOW (ref 3.5–5.0)
Alkaline Phosphatase: 57 U/L (ref 38–126)
Anion gap: 9 (ref 5–15)
BUN: 12 mg/dL (ref 8–23)
CO2: 25 mmol/L (ref 22–32)
Calcium: 8.9 mg/dL (ref 8.9–10.3)
Chloride: 105 mmol/L (ref 98–111)
Creatinine, Ser: 0.63 mg/dL (ref 0.44–1.00)
GFR, Estimated: >60 mL/min (ref >60)
Glucose, Bld: 101 mg/dL — ABNORMAL HIGH (ref 70–99)
Potassium: 3.2 mmol/L — ABNORMAL LOW (ref 3.5–5.1)
Sodium: 139 mmol/L (ref 135–145)
Total Bilirubin: 1 mg/dL (ref 0.0–1.2)
Total Protein: 6.5 g/dL (ref 6.5–8.1)

## 2023-12-02 LAB — CBC
HCT: 33.7 % — ABNORMAL LOW (ref 36.0–46.0)
Hemoglobin: 11.4 g/dL — ABNORMAL LOW (ref 12.0–15.0)
MCH: 27 pg (ref 26.0–34.0)
MCHC: 33.8 g/dL (ref 30.0–36.0)
MCV: 79.9 fL — ABNORMAL LOW (ref 80.0–100.0)
Platelets: 315 10*3/uL (ref 150–400)
RBC: 4.22 MIL/uL (ref 3.87–5.11)
RDW: 15.1 % (ref 11.5–15.5)
WBC: 4.3 10*3/uL (ref 4.0–10.5)
nRBC: 0 % (ref 0.0–0.2)

## 2023-12-02 MED ORDER — GUAIFENESIN ER 600 MG PO TB12
1200.0000 mg | ORAL_TABLET | Freq: Two times a day (BID) | ORAL | 0 refills | Status: DC
  Filled 2023-12-02: qty 60, 15d supply, fill #0

## 2023-12-02 MED ORDER — METOPROLOL TARTRATE 25 MG PO TABS: 12.5000 mg | ORAL_TABLET | Freq: Two times a day (BID) | ORAL | 1 refills | Status: DC

## 2023-12-02 MED ORDER — METOPROLOL SUCCINATE ER 25 MG PO TB24: 25.0000 mg | ORAL_TABLET | Freq: Every day | ORAL | 11 refills | Status: DC

## 2023-12-02 MED ORDER — APIXABAN 5 MG PO TABS: ORAL_TABLET | ORAL | 0 refills | Status: DC

## 2023-12-02 MED ORDER — AMOXICILLIN-POT CLAVULANATE 875-125 MG PO TABS: 1.0000 | ORAL_TABLET | Freq: Two times a day (BID) | ORAL | 0 refills | Status: DC

## 2023-12-02 MED ORDER — DAPAGLIFLOZIN PROPANEDIOL 10 MG PO TABS
10.0000 mg | ORAL_TABLET | Freq: Every day | ORAL | 1 refills | Status: DC
Start: 2023-12-03 — End: 2023-12-02

## 2023-12-02 MED ORDER — AMOXICILLIN-POT CLAVULANATE 875-125 MG PO TABS
1.0000 | ORAL_TABLET | Freq: Two times a day (BID) | ORAL | 0 refills | Status: AC
  Filled 2023-12-02: qty 6, 3d supply, fill #0

## 2023-12-02 MED ORDER — APIXABAN 5 MG PO TABS
ORAL_TABLET | ORAL | 0 refills | Status: DC
  Filled 2023-12-02: qty 60, 27d supply, fill #0

## 2023-12-02 MED ORDER — BENZONATATE 100 MG PO CAPS
100.0000 mg | ORAL_CAPSULE | Freq: Two times a day (BID) | ORAL | 0 refills | Status: DC | PRN
  Filled 2023-12-02: qty 20, 10d supply, fill #0

## 2023-12-02 MED ORDER — FUROSEMIDE 20 MG PO TABS: 40.0000 mg | ORAL_TABLET | Freq: Every day | ORAL | 0 refills | Status: DC | PRN

## 2023-12-02 MED ORDER — DOXYCYCLINE HYCLATE 100 MG PO TABS
100.0000 mg | ORAL_TABLET | Freq: Two times a day (BID) | ORAL | 0 refills | Status: AC
  Filled 2023-12-02: qty 4, 2d supply, fill #0

## 2023-12-02 MED ORDER — DAPAGLIFLOZIN PROPANEDIOL 10 MG PO TABS
10.0000 mg | ORAL_TABLET | Freq: Every day | ORAL | 1 refills | Status: DC
Start: 2023-12-03 — End: 2023-12-02
  Filled 2023-12-02: qty 30, 30d supply, fill #0

## 2023-12-02 MED ORDER — POTASSIUM CHLORIDE CRYS ER 20 MEQ PO TBCR
40.0000 meq | EXTENDED_RELEASE_TABLET | Freq: Once | ORAL | Status: AC
  Administered 2023-12-02: 40 meq via ORAL
  Filled 2023-12-02: qty 2

## 2023-12-02 MED ORDER — DOXYCYCLINE HYCLATE 100 MG PO TABS: 100.0000 mg | ORAL_TABLET | Freq: Two times a day (BID) | ORAL | 0 refills | Status: DC

## 2023-12-02 MED ORDER — FARXIGA 10 MG PO TABS: 10.0000 mg | ORAL_TABLET | Freq: Every day | ORAL | 1 refills | Status: DC

## 2023-12-02 MED ORDER — GUAIFENESIN ER 600 MG PO TB12
1200.0000 mg | ORAL_TABLET | Freq: Two times a day (BID) | ORAL | 0 refills | Status: DC
Start: 2023-12-02 — End: 2023-12-02

## 2023-12-02 MED ORDER — BENZONATATE 100 MG PO CAPS: 100.0000 mg | ORAL_CAPSULE | Freq: Two times a day (BID) | ORAL | 0 refills | Status: DC | PRN

## 2023-12-02 MED ORDER — METOPROLOL SUCCINATE ER 25 MG PO TB24
25.0000 mg | ORAL_TABLET | Freq: Every day | ORAL | 11 refills | Status: DC
Start: 2023-12-03 — End: 2023-12-10
  Filled 2023-12-02: qty 30, 30d supply, fill #0

## 2023-12-02 NOTE — Progress Notes (Incomplete)
 Patient c/o off and on nagging chest pressure. It is not specificall associated with exertion. It has happened at rest

## 2023-12-02 NOTE — Progress Notes (Signed)
 Heart Failure Navigator Progress Note  Assessed for Heart & Vascular TOC clinic readiness.  Patient does not meet criteria due to - [facility] scheduled appointment 12/17/23. No HF TOC.   Navigator will sign off at this time.   Randie Bustle, BSN, Scientist, clinical (histocompatibility and immunogenetics) Only

## 2023-12-02 NOTE — Progress Notes (Signed)
 Mobility Specialist Progress Note;   12/02/23 1117  Mobility  Activity Ambulated independently in hallway  Level of Assistance Modified independent, requires aide device or extra time  Assistive Device None  Distance Ambulated (ft) 400 ft  Activity Response Tolerated well  Mobility Referral Yes  Mobility visit 1 Mobility  Mobility Specialist Start Time (ACUTE ONLY) 1117  Mobility Specialist Stop Time (ACUTE ONLY) 1127  Mobility Specialist Time Calculation (min) (ACUTE ONLY) 10 min   Pt agreeable to mobility. Required no physical assistance during ambulation, ModI. VSS throughout. HR up to 80 bpm w/ activity. Pt returned back to bed with all needs met.   Janit Meline Mobility Specialist Please contact via SecureChat or [employer] [phone]

## 2023-12-02 NOTE — Progress Notes (Signed)
   Patient Name: [name] Date of Encounter: 12/02/2023 [facility] Cardiologist: Maudine Sos, MD   Interval Summary  .    Patient doing well post-cardioversion Denies any chest pain or shortness of breath Still on IV antibiotics in regards to her PNA Eager to go home   Vital Signs .    Vitals:   12/01/23 1639 12/01/23 2020 12/01/23 2211 12/02/23 0000  BP: 92/67 116/67 99/60 111/76  Pulse: 73 73 94 70  Resp: 18 18  18   Temp: 98.7 F (37.1 C) 98.5 F (36.9 C)  97.9 F (36.6 C)  TempSrc: Oral Oral  Oral  SpO2: 98% 99%  97%  Weight:      Height:        Intake/Output Summary (Last 24 hours) at 12/02/2023 0827 Last data filed at 12/01/2023 1644 Gross per 24 hour  Intake 220 ml  Output --  Net 220 ml      12/01/2023    4:41 AM 11/30/2023    5:00 AM 11/28/2023   12:11 PM  Last 3 Weights  Weight (lbs) 199 lb 14.4 oz 204 lb 11.2 oz 200 lb 9.9 oz  Weight (kg) 90.674 kg 92.851 kg 91 kg        Telemetry/ECG .    Sinus rhythm, HR 60-70s, PVCs - Personally Reviewed  Physical Exam .   GEN: No acute distress.   Neck: No JVD Cardiac: RRR, no murmurs, rubs, or gallops.  Respiratory: Clear to auscultation bilaterally. GI: Soft, nontender, non-distended  MS: No edema  Assessment & Plan .   68 y.o. female with history of paroxysmal atrial flutter, near syncope, who is being seen for atrial flutter   Atrial flutter with RVR s/p DCCV 12/01/2023 Elevated troponin level  Echo this admission showed: EF 40-45%, mildly reduced RV function, mildly dilated LA Underwent successful TEE guided DCCV on 5/14 HR currently in 70s, remains in NSR post DCCV  Troponin levels 24 > 69 > 42, likely due to demand ischemia Doing well post cardioversion, remains in NSR, no complaints  Continue on Lopressor 12.5 mg BID  Continue on Eliquis per DVT protocol -- 10 mg BID followed by 5 mg BID   Acute systolic and diastolic CHF  Given IV Lasix 20 mg x 2 doses  Low-normal BP  has made GDMT titration difficult  Started on Farxiga 10 mg daily yesterday, done well    Per primary  Atypical PNA Hypotension Acute DVT of LLE Hypokalemia  Anemia   For questions or updates, please contact [facility] Please consult [email or web address] for contact info under        Signed, Jiles Mote, PA-C

## 2023-12-02 NOTE — Discharge Summary (Addendum)
 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 *  Hospital Course: 68 year old with past medical history significant for paroxysmal A- for presented to the ED after near syncope episode at church when she began feeling lightheaded, no loss of consciousness.  CTA chest was negative for PE, did show patchy groundglass opacity in the left and lower lobe segment suspicious for pneumonia and hilar lymphadenopathy. Afebrile, WBC normalized, stable for discharge  Assessment and Plan: 1-Syncope: -Received IV fluids. -In setting PNA and A flutter.  -No further episode.  Paroxysmal a flutter:  -Cardiology following -Underwent  TEE cardioversion 5/14.   Acute systolic and diastolic heart failure: -ECHO EF 40 --45 % -Cardiology following. -Received 2 doses IV lasix.  -Discharge on PRN lasix.  Left upper lobe opacity question pneumonia versus aspiration pneumonia -Treated  IV Unasyn and doxycycline. Discharge on Augmentin and Doxy to complete 7 days. COVID and RSV negative Like fever overnight 5/13 Check blood cultures.No growth to date. Complete resp panel ordered. Positive for Metapneumovirus. MRSA Negative Afebrile, stable for discharge  Hypotension;  -Thought to be  related to dehydration, initially, received IV fluids. Also PNA> Resolved.   Acute DVT of tibial vein and left lower extremity -On Eliquis   Hypokalemia -Replaced.   Normocytic anemia -Monitor hb.    Hypokalemia; replaced.       Consultants: Cardiology  Procedures performed: Cardioversion   Disposition: Home Diet recommendation:  Discharge Diet Orders (From admission, onward)     Start     Ordered   12/02/23 0000  Diet - low sodium heart healthy        12/02/23 1213           Carb modified diet DISCHARGE MEDICATION: Allergies as of 12/02/2023       Reactions   Peanut (diagnostic) Hives   Coconut (cocos Nucifera) Anxiety, Swelling        Medication List     STOP taking these medications    BABY ASPIRIN PO   saw palmetto 160 MG capsule   Turmeric-Ginger 130-5 MG Chew       TAKE these medications    amoxicillin-clavulanate 875-125 MG tablet Commonly known as: AUGMENTIN Take 1 tablet by mouth 2 (two) times daily for 3 days.   apixaban 5 MG Tabs tablet Commonly known as: ELIQUIS Take 2 tablets (10 mg total) by mouth 2 (two) times daily for 3 days, THEN 1 tablet (5 mg total) 2 (two) times daily. Start taking on: Dec 02, 2023 What changed: See the new instructions.   benzonatate 100 MG capsule Commonly known as: TESSALON Take 1 capsule (100 mg total) by mouth 2 (two) times daily as needed for cough.   cholecalciferol 25 MCG (1000 UNIT) tablet Commonly known as: VITAMIN D3 Take  1,000 Units by mouth daily.   dapagliflozin propanediol 10 MG Tabs tablet Commonly known as: FARXIGA Take 1 tablet (10 mg total) by mouth daily. Start taking on: Dec 03, 2023   doxycycline 100 MG tablet Commonly known as: VIBRA-TABS Take 1 tablet (100 mg total) by mouth every 12 (twelve) hours for 2 days.   furosemide 20 MG tablet Commonly known as: LASIX Take 2 tablets (40 mg total) by mouth daily as needed (for more than 2 pounds weight gain in 24.). What  changed:  how much to take reasons to take this   guaiFENesin 600 MG 12 hr tablet Commonly known as: MUCINEX Take 2 tablets (1,200 mg total) by mouth 2 (two) times daily.   Magnesium Complex High Potency 250 MG Caps Generic drug: Magnesium Take 1 capsule by mouth at bedtime.   metoprolol succinate 25 MG 24 hr tablet Commonly known as: Toprol XL Take 1 tablet (25 mg total) by mouth daily. Start taking on: Dec 03, 2023   Super Calcium 1500 (600 Ca) MG Tabs tablet Generic drug: calcium carbonate Take 1 tablet by mouth daily.   VITAMIN B COMPLEX 100 IJ Take 1 tablet by mouth daily.        Follow-up Information     Bufford Carne, FNP Follow up in 1 week(s).   Specialty: Family Medicine Contact information: [address] [place] [address] [phone]         Olinda Bertrand, DO Follow up in 1 week(s).   Specialties: Cardiology, Vascular Surgery Contact information: [address] [place] [address] [phone]                Discharge Exam: Cleavon Curls Weights   11/28/23 1211 11/30/23 0500 12/01/23 0441  Weight: 91 kg 92.9 kg 90.7 kg   General; NAD  Condition at discharge: stable  The results of significant diagnostics from this hospitalization (including imaging, microbiology, ancillary and laboratory) are listed below for reference.   Imaging Studies: ECHO TEE Result Date: 12/01/2023    TRANSESOPHOGEAL ECHO REPORT   Patient Name:   [name] Date of Exam: 12/01/2023 Medical Rec #:  [id number]           Height:       64.0 in Accession #:    [id number]          Weight:       199.9 lb Date of Birth:  01/15/1956          BSA:          1.956 m Patient Age:    67 years            BP:           133/85 mmHg Patient Gender: F                   HR:           136 bpm. Exam Location:  Inpatient Procedure: 3D Echo, Transesophageal Echo, Cardiac Doppler and Color Doppler            (Both Spectral and Color Flow Doppler were  utilized during            procedure). Indications:     I48.92* Unspecified atrial flutter  History:         Patient has prior history of Echocardiogram examinations, most                  recent 11/29/2023. CHF, Abnormal  ECG, Arrythmias:Atrial Flutter,                  Signs/Symptoms:Syncope; Risk Factors:Diabetes.  Sonographer:     Raynelle Callow RDCS Referring Phys:  [id number] Carolynne Citron A PARCELLS Diagnosing Phys: Sheryle Donning MD PROCEDURE: After discussion of the risks and benefits of a TEE, an informed consent was obtained from the patient. The transesophogeal probe was passed without difficulty through the esophogus of the patient. Imaged were obtained with the patient in a left lateral decubitus position. Sedation performed by different physician. The patient was monitored while under deep sedation. Anesthestetic sedation was provided intravenously by Anesthesiology: 382mg  of Propofol. Image quality was excellent. The patient's vital signs; including heart rate, blood pressure, and oxygen saturation; remained stable throughout the procedure. The patient developed no complications during the procedure. A successful direct current cardioversion was performed at 200 joules with 1 attempt.  IMPRESSIONS  1. Left ventricular ejection fraction, by estimation, is 40 to 45%. The left ventricle has mildly decreased function. The left ventricle demonstrates global hypokinesis.  2. Right ventricular systolic function is mildly reduced. The right ventricular size is normal.  3. Left atrial size was moderately dilated. No left atrial/left atrial appendage thrombus was detected. The LAA emptying velocity was 59 cm/s.  4. The mitral valve is normal in structure. Mild mitral valve regurgitation. No evidence of mitral stenosis.  5. The aortic valve is tricuspid. Aortic valve regurgitation is trivial. No aortic stenosis is present.  6. 3D performed of the LAA and demonstrates 3D of LAA without thrombus.  Conclusion(s)/Recommendation(s): No LA/LAA thrombus identified. Successful cardioversion performed with restoration of normal sinus rhythm. FINDINGS  Left Ventricle: Left ventricular ejection fraction, by estimation, is 40 to 45%. The left ventricle has mildly decreased function. The left ventricle demonstrates global hypokinesis. The left ventricular internal cavity size was normal in size. Right Ventricle: The right ventricular size is normal. No increase in right ventricular wall thickness. Right ventricular systolic function is mildly reduced. Left Atrium: Left atrial size was moderately dilated. No left atrial/left atrial appendage thrombus was detected. The LAA emptying velocity was 59 cm/s. Right Atrium: Right atrial size was normal in size. Pericardium: Trivial pericardial effusion is present. The pericardial effusion is anterior to the right ventricle. Mitral Valve: The mitral valve is normal in structure. Mild mitral valve regurgitation. No evidence of mitral valve stenosis. Tricuspid Valve: The tricuspid valve is normal in structure. Tricuspid valve regurgitation is mild . No evidence of tricuspid stenosis. Aortic Valve: The aortic valve is tricuspid. Aortic valve regurgitation is trivial. No aortic stenosis is present. Pulmonic Valve: The pulmonic valve was grossly normal. Pulmonic valve regurgitation is mild. No evidence of pulmonic stenosis. Aorta: The aortic root and ascending aorta are structurally normal, with no evidence of dilitation. IAS/Shunts: No atrial level shunt detected by color flow Doppler. Additional Comments: Spectral Doppler performed. Sheryle Donning MD Electronically signed by Sheryle Donning MD Signature Date/Time: 12/01/2023/9:03:58 PM    Final    EP STUDY Result Date: 12/01/2023 See surgical note for result.  ECHOCARDIOGRAM COMPLETE Result Date: 11/29/2023    ECHOCARDIOGRAM REPORT   Patient Name:   [name] Date of Exam: 11/29/2023 Medical Rec #:   [id number]           Height:       64.0 in Accession #:    [id number]          Weight:       200.6 lb Date of Birth:  17-Aug-1955  BSA:          1.959 m Patient Age:    67 years            BP:           121/86 mmHg Patient Gender: F                   HR:           97 bpm. Exam Location:  Inpatient Procedure: 2D Echo, Color Doppler and Cardiac Doppler (Both Spectral and Color            Flow Doppler were utilized during procedure). Indications:    Atrial flutter  History:        Patient has no prior history of Echocardiogram examinations.                 DVT.  Sonographer:    Juanita Shaw Referring Phys: [id number] VISHAL R PATEL IMPRESSIONS  1. Left ventricular ejection fraction, by estimation, is 40 to 45%. The left ventricle has mildly decreased function. The left ventricle demonstrates global hypokinesis. Left ventricular diastolic parameters are indeterminate.  2. Right ventricular systolic function is mildly reduced. The right ventricular size is normal. Tricuspid regurgitation signal is inadequate for assessing PA pressure.  3. Left atrial size was mildly dilated.  4. The mitral valve is normal in structure. Trivial mitral valve regurgitation. No evidence of mitral stenosis. Moderate mitral annular calcification.  5. The aortic valve is tricuspid. There is mild calcification of the aortic valve. Aortic valve regurgitation is not visualized. No aortic stenosis is present.  6. The inferior vena cava is dilated in size with <50% respiratory variability, suggesting right atrial pressure of 15 mmHg.  7. The patient was in atrial flutter.  8. Technically difficult study with poor acoustic windows. FINDINGS  Left Ventricle: Left ventricular ejection fraction, by estimation, is 40 to 45%. The left ventricle has mildly decreased function. The left ventricle demonstrates global hypokinesis. The left ventricular internal cavity size was normal in size. There is  no left ventricular hypertrophy. Left ventricular  diastolic parameters are indeterminate. Right Ventricle: The right ventricular size is normal. No increase in right ventricular wall thickness. Right ventricular systolic function is mildly reduced. Tricuspid regurgitation signal is inadequate for assessing PA pressure. Left Atrium: Left atrial size was mildly dilated. Right Atrium: Right atrial size was normal in size. Pericardium: There is no evidence of pericardial effusion. Mitral Valve: The mitral valve is normal in structure. Moderate mitral annular calcification. Trivial mitral valve regurgitation. No evidence of mitral valve stenosis. MV peak gradient, 5.9 mmHg. The mean mitral valve gradient is 2.0 mmHg. Tricuspid Valve: The tricuspid valve is normal in structure. Tricuspid valve regurgitation is trivial. Aortic Valve: The aortic valve is tricuspid. There is mild calcification of the aortic valve. Aortic valve regurgitation is not visualized. No aortic stenosis is present. Aortic valve mean gradient measures 3.0 mmHg. Aortic valve peak gradient measures 4.9 mmHg. Aortic valve area, by VTI measures 2.96 cm. Pulmonic Valve: The pulmonic valve was normal in structure. Pulmonic valve regurgitation is trivial. Aorta: The aortic root is normal in size and structure. Venous: The inferior vena cava is dilated in size with less than 50% respiratory variability, suggesting right atrial pressure of 15 mmHg. IAS/Shunts: No atrial level shunt detected by color flow Doppler.  LEFT VENTRICLE PLAX 2D LVIDd:         4.70 cm      Diastology LVIDs:  3.30 cm      LV e' medial:    12.70 cm/s LV PW:         0.70 cm      LV E/e' medial:  8.5 LV IVS:        0.80 cm      LV e' lateral:   12.00 cm/s LVOT diam:     2.00 cm      LV E/e' lateral: 9.0 LV SV:         50 LV SV Index:   25 LVOT Area:     3.14 cm  LV Volumes (MOD) LV vol d, MOD A2C: 116.0 ml LV vol d, MOD A4C: 137.0 ml LV vol s, MOD A2C: 41.3 ml LV vol s, MOD A4C: 59.0 ml LV SV MOD A2C:     74.7 ml LV SV MOD A4C:      137.0 ml LV SV MOD BP:      77.8 ml RIGHT VENTRICLE             IVC RV Basal diam:  3.00 cm     IVC diam: 2.10 cm RV Mid diam:    1.90 cm RV S prime:     10.10 cm/s TAPSE (M-mode): 2.9 cm LEFT ATRIUM           Index        RIGHT ATRIUM           Index LA diam:      4.50 cm 2.30 cm/m   RA Area:     11.90 cm LA Vol (A2C): 41.5 ml 21.18 ml/m  RA Volume:   24.00 ml  12.25 ml/m LA Vol (A4C): 61.4 ml 31.34 ml/m  AORTIC VALVE                    PULMONIC VALVE AV Area (Vmax):    2.43 cm     PV Vmax:       0.65 m/s AV Area (Vmean):   2.42 cm     PV Peak grad:  1.7 mmHg AV Area (VTI):     2.96 cm AV Vmax:           110.53 cm/s AV Vmean:          78.433 cm/s AV VTI:            0.168 m AV Peak Grad:      4.9 mmHg AV Mean Grad:      3.0 mmHg LVOT Vmax:         85.40 cm/s LVOT Vmean:        60.500 cm/s LVOT VTI:          0.158 m LVOT/AV VTI ratio: 0.94  AORTA Ao Root diam: 3.30 cm Ao Asc diam:  3.00 cm MITRAL VALVE MV Area (PHT): 4.01 cm     SHUNTS MV Area VTI:   1.80 cm     Systemic VTI:  0.16 m MV Peak grad:  5.9 mmHg     Systemic Diam: 2.00 cm MV Mean grad:  2.0 mmHg MV Vmax:       1.21 m/s MV Vmean:      69.7 cm/s MV Decel Time: 189 msec MV E velocity: 108.00 cm/s Dalton McleanMD Electronically signed by Archer Bear Signature Date/Time: 11/29/2023/7:39:26 PM    Final    VAS US  LOWER EXTREMITY VENOUS (DVT) (7a-7p) Result Date: 11/29/2023  Lower Venous DVT Study Patient Name:  [name]  Date of  Exam:   11/28/2023 Medical Rec #: [id number]            Accession #:    [id number] Date of Birth: [birth year]           Patient Gender: F Patient Age:   84 years Exam Location:  [facility] Procedure:      VAS US  LOWER EXTREMITY VENOUS (DVT) Referring Phys: AMJAD ALI --------------------------------------------------------------------------------  Indications: Swelling, and Dizziness, hypotension. Aflutter with RVR Other Indications: New diagnosis of Aflutter by primary care, prescribed Eliquis                     but delayed starting it. Risk Factors: Aflutter. Comparison Study: No prior study on file Performing Technologist: Carleene Chase RVS  Examination Guidelines: A complete evaluation includes B-mode imaging, spectral Doppler, color Doppler, and power Doppler as needed of all accessible portions of each vessel. Bilateral testing is considered an integral part of a complete examination. Limited examinations for reoccurring indications may be performed as noted. The reflux portion of the exam is performed with the patient in reverse Trendelenburg.  +-----+---------------+---------+-----------+----------+--------------+ RIGHTCompressibilityPhasicitySpontaneityPropertiesThrombus Aging +-----+---------------+---------+-----------+----------+--------------+ CFV  Full           Yes      Yes                                 +-----+---------------+---------+-----------+----------+--------------+   +---------+---------------+---------+-----------+----------+--------------+ LEFT     CompressibilityPhasicitySpontaneityPropertiesThrombus Aging +---------+---------------+---------+-----------+----------+--------------+ CFV      Full           Yes      Yes                                 +---------+---------------+---------+-----------+----------+--------------+ SFJ      Full                                                        +---------+---------------+---------+-----------+----------+--------------+ FV Prox  Full                                                        +---------+---------------+---------+-----------+----------+--------------+ FV Mid   Full                                                        +---------+---------------+---------+-----------+----------+--------------+ FV DistalFull                                                        +---------+---------------+---------+-----------+----------+--------------+ PFV      Full                                                         +---------+---------------+---------+-----------+----------+--------------+  POP      Full           Yes      Yes                                 +---------+---------------+---------+-----------+----------+--------------+ PTV      Partial                                      Acute          +---------+---------------+---------+-----------+----------+--------------+ PERO     Partial                                      Acute          +---------+---------------+---------+-----------+----------+--------------+    Summary: RIGHT: - No evidence of common femoral vein obstruction.   LEFT: - Findings consistent with acute deep vein thrombosis involving the proximal segments of the left posterior tibial veins, and left peroneal veins.   *See table(s) above for measurements and observations. Electronically signed by Genny Kid MD on 11/29/2023 at 3:43:02 PM.    Final    CT Angio Chest PE W and/or Wo Contrast Result Date: 11/28/2023 CLINICAL DATA:  Pulmonary embolism (PE) suspected, high prob Syncope. EXAM: CT ANGIOGRAPHY CHEST WITH CONTRAST TECHNIQUE: Multidetector CT imaging of the chest was performed using the standard protocol during bolus administration of intravenous contrast. Multiplanar CT image reconstructions and MIPs were obtained to evaluate the vascular anatomy. RADIATION DOSE REDUCTION: This exam was performed according to the departmental dose-optimization program which includes automated exposure control, adjustment of the mA and/or kV according to patient size and/or use of iterative reconstruction technique. CONTRAST:  75mL OMNIPAQUE IOHEXOL 350 MG/ML SOLN COMPARISON:  Radiograph earlier today FINDINGS: Cardiovascular: There are no filling defects within the pulmonary arteries to suggest pulmonary embolus. The heart is normal in size. No pericardial effusion. Trace atherosclerosis of the thoracic aorta. No aneurysm. Mediastinum/Nodes: Prominent left  hilar nodes measuring up to 10 mm, likely reactive. There also prominent prevascular nodes measuring up to 8 mm short axis. Calcified left thyroid nodule measuring 11 mm in the left lobe. Not clinically significant; no follow-up imaging recommended (ref: J Am Coll Radiol. 2015 Feb;12(2): 143-50). Unremarkable esophagus. Lungs/Pleura: Patchy ground-glass CP and nodular opacities in the left upper and to a lesser extent lower lobe typical of pneumonia. There is also minimal dependent opacity in the right lower lobe. Mild central bronchial thickening. No pleural fluid. Trachea and central airways are clear. Upper Abdomen: No acute findings.  Cholecystectomy. Musculoskeletal: There are no acute or suspicious osseous abnormalities. Thoracic spondylosis. Review of the MIP images confirms the above findings. IMPRESSION: 1. No pulmonary embolus. 2. Patchy ground-glass the and nodular opacities in the left upper and to a lesser extent lower lobe typical of pneumonia. 3. Prominent left hilar and prevascular nodes, likely reactive. Aortic Atherosclerosis (ICD10-I70.0). Electronically Signed   By: Chadwick Colonel M.D.   On: 11/28/2023 19:34   DG Chest Portable 1 View Result Date: 11/28/2023 CLINICAL DATA:  Syncope EXAM: PORTABLE CHEST 1 VIEW COMPARISON:  Chest radiograph dated 01/11/2011 FINDINGS: Normal lung volumes. Multifocal patchy left lung opacities. No pleural effusion or pneumothorax. The heart size and mediastinal contours are within normal limits. No acute osseous abnormality. IMPRESSION:  Multifocal patchy left lung opacities, which may represent multifocal aspiration or pneumonia. Electronically Signed   By: Limin  Xu M.D.   On: 11/28/2023 13:49    Microbiology: Results for orders placed or performed during the hospital encounter of 11/28/23  Resp panel by RT-PCR (RSV, Flu A&B, Covid) Anterior Nasal Swab     Status: None   Collection Time: 11/28/23 10:01 PM   Specimen: Anterior Nasal Swab  Result Value  Ref Range Status   SARS Coronavirus 2 by RT PCR NEGATIVE NEGATIVE Final   Influenza A by PCR NEGATIVE NEGATIVE Final   Influenza B by PCR NEGATIVE NEGATIVE Final    Comment: (NOTE) The Xpert Xpress SARS-CoV-2/FLU/RSV plus assay is intended as an aid in the diagnosis of influenza from Nasopharyngeal swab specimens and should not be used as a sole basis for treatment. Nasal washings and aspirates are unacceptable for Xpert Xpress SARS-CoV-2/FLU/RSV testing.  Fact Sheet for Patients: BloggerCourse.com  Fact Sheet for Healthcare Providers: SeriousBroker.it  This test is not yet approved or cleared by the [place]  FDA and has been authorized for detection and/or diagnosis of SARS-CoV-2 by FDA under an Emergency Use Authorization (EUA). This EUA will remain in effect (meaning this test can be used) for the duration of the COVID-19 declaration under Section 564(b)(1) of the Act, 21 U.S.C. section 360bbb-3(b)(1), unless the authorization is terminated or revoked.     Resp Syncytial Virus by PCR NEGATIVE NEGATIVE Final    Comment: (NOTE) Fact Sheet for Patients: BloggerCourse.com  Fact Sheet for Healthcare Providers: SeriousBroker.it  This test is not yet approved or cleared by the [place]  FDA and has been authorized for detection and/or diagnosis of SARS-CoV-2 by FDA under an Emergency Use Authorization (EUA). This EUA will remain in effect (meaning this test can be used) for the duration of the COVID-19 declaration under Section 564(b)(1) of the Act, 21 U.S.C. section 360bbb-3(b)(1), unless the authorization is terminated or revoked.  Performed at [facility], [address], [place], [place] [address]   MRSA Next Gen by PCR, Nasal     Status: None   Collection Time: 12/01/23  7:37 AM   Specimen: Nasal Mucosa; Nasal Swab  Result Value Ref Range Status   MRSA by  PCR Next Gen NOT DETECTED NOT DETECTED Final    Comment: (NOTE) The GeneXpert MRSA Assay (FDA approved for NASAL specimens only), is one component of a comprehensive MRSA colonization surveillance program. It is not intended to diagnose MRSA infection nor to guide or monitor treatment for MRSA infections. Test performance is not FDA approved in patients less than 55 years old. Performed at [facility], [address] [place], [place] [address]   Respiratory (~20 pathogens) panel by PCR     Status: Abnormal   Collection Time: 12/01/23  7:37 AM   Specimen: Nasopharyngeal Swab; Respiratory  Result Value Ref Range Status   Adenovirus NOT DETECTED NOT DETECTED Final   Coronavirus 229E NOT DETECTED NOT DETECTED Final    Comment: (NOTE) The Coronavirus on the Respiratory Panel, DOES NOT test for the novel  Coronavirus (2019 nCoV)    Coronavirus HKU1 NOT DETECTED NOT DETECTED Final   Coronavirus NL63 NOT DETECTED NOT DETECTED Final   Coronavirus OC43 NOT DETECTED NOT DETECTED Final   Metapneumovirus DETECTED (A) NOT DETECTED Final   Rhinovirus / Enterovirus NOT DETECTED NOT DETECTED Final   Influenza A NOT DETECTED NOT DETECTED Final   Influenza B NOT DETECTED NOT DETECTED Final  Parainfluenza Virus 1 NOT DETECTED NOT DETECTED Final   Parainfluenza Virus 2 NOT DETECTED NOT DETECTED Final   Parainfluenza Virus 3 NOT DETECTED NOT DETECTED Final   Parainfluenza Virus 4 NOT DETECTED NOT DETECTED Final   Respiratory Syncytial Virus NOT DETECTED NOT DETECTED Final   Bordetella pertussis NOT DETECTED NOT DETECTED Final   Bordetella Parapertussis NOT DETECTED NOT DETECTED Final   Chlamydophila pneumoniae NOT DETECTED NOT DETECTED Final   Mycoplasma pneumoniae NOT DETECTED NOT DETECTED Final    Comment: Performed at [facility], [address], [place], [place] [address]  Culture, blood (Routine X 2) w Reflex to ID Panel     Status: None (Preliminary result)   Collection Time:  12/01/23  8:13 AM   Specimen: BLOOD LEFT ARM  Result Value Ref Range Status   Specimen Description BLOOD LEFT ARM  Final   Special Requests   Final    BOTTLES DRAWN AEROBIC AND ANAEROBIC Blood Culture adequate volume   Culture   Final    NO GROWTH 1 DAY Performed at [facility], [address], [place], [place] [address]    Report Status PENDING  Incomplete  Culture, blood (Routine X 2) w Reflex to ID Panel     Status: None (Preliminary result)   Collection Time: 12/01/23  8:13 AM   Specimen: BLOOD RIGHT ARM  Result Value Ref Range Status   Specimen Description BLOOD RIGHT ARM  Final   Special Requests   Final    BOTTLES DRAWN AEROBIC AND ANAEROBIC Blood Culture adequate volume   Culture   Final    NO GROWTH 1 DAY Performed at [facility], [address], [place], [place] [address]    Report Status PENDING  Incomplete    Labs: CBC: Recent Labs  Lab 11/28/23 1211 11/29/23 0508 12/01/23 0813 12/02/23 0515  WBC 4.3 4.3 4.6 4.3  NEUTROABS 2.6  --   --   --   HGB 12.2 10.7* 12.6 11.4*  HCT 38.0 32.1* 37.0 33.7*  MCV 85.0 82.3 78.7* 79.9*  PLT 362 284 329 315   Basic Metabolic Panel: Recent Labs  Lab 11/28/23 1211 11/28/23 2214 11/29/23 0508 12/01/23 0813 12/02/23 0515  NA 141  --  140 138 139  K 3.4*  --  4.0 3.8 3.2*  CL 107  --  109 105 105  CO2 20*  --  22 24 25   GLUCOSE 104*  --  87 106* 101*  BUN 21  --  8 8 12   CREATININE 0.88  --  0.54 0.62 0.63  CALCIUM 9.2  --  8.8* 9.0 8.9  MG  --  1.7  --  1.8  --    Liver Function Tests: Recent Labs  Lab 12/02/23 0515  AST 26  ALT 51*  ALKPHOS 57  BILITOT 1.0  PROT 6.5  ALBUMIN 3.1*   CBG: No results for input(s): "GLUCAP" in the last 168 hours.  Discharge time spent: greater than 30 minutes.  Signed: Danette Duos, MD [facility] 12/02/2023

## 2023-12-03 ENCOUNTER — Telehealth (HOSPITAL_BASED_OUTPATIENT_CLINIC_OR_DEPARTMENT_OTHER): Payer: Self-pay | Admitting: Cardiovascular Disease

## 2023-12-03 ENCOUNTER — Other Ambulatory Visit (HOSPITAL_COMMUNITY): Payer: Self-pay

## 2023-12-03 ENCOUNTER — Telehealth: Payer: Self-pay

## 2023-12-03 NOTE — Telephone Encounter (Signed)
 Patient calling the office for samples of medication:   1.  What medication and dosage are you requesting samples for? Farxiga  2.  Are you currently out of this medication? Yes- patient said if you do not have it, she will need to change to another medicine.

## 2023-12-03 NOTE — Transitions of Care (Post Inpatient/ED Visit) (Signed)
 12/03/2023  Name: [name] MRN: [id number] DOB: 10-26-1955  Today's TOC FU Call Status: Today's TOC FU Call Status:: Successful TOC FU Call Completed TOC FU Call Complete Date: 12/03/23 Patient's Name and Date of Birth confirmed.  Transition Care Management Follow-up Telephone Call Date of Discharge: 12/02/23 Discharge Facility: [facility]) Type of Discharge: Inpatient Admission Primary Inpatient Discharge Diagnosis:: aflutter How have you been since you were released from the hospital?: Better Any questions or concerns?: No  Items Reviewed: Did you receive and understand the discharge instructions provided?: Yes Medications obtained,verified, and reconciled?: Yes (Medications Reviewed) Any new allergies since your discharge?: No Dietary orders reviewed?: Yes Do you have support at home?: Yes People in Home [RPT]: spouse  Medications Reviewed Today: Medications Reviewed Today     Reviewed by Darrall Ellison, LPN (Licensed Practical Nurse) on 12/03/23 at [id number]  Med List Status: <None>   Medication Order Taking? Sig Documenting Provider Last Dose Status Informant  amoxicillin-clavulanate (AUGMENTIN) 875-125 MG tablet [id number] Yes Take 1 tablet by mouth 2 (two) times daily for 3 days. Regalado, Brigido Canales, MD Taking Active   apixaban (ELIQUIS) 5 MG TABS tablet [id number] Yes Take 2 tablets (10 mg total) by mouth 2 (two) times daily for 3 days, THEN 1 tablet (5 mg total) 2 (two) times daily. Regalado, Brigido Canales, MD Taking Active   B Complex Vitamins (VITAMIN B COMPLEX 100 IJ) [id number] Yes Take 1 tablet by mouth daily. [provider] Taking Active Self, Spouse/Significant Other, Pharmacy Records  benzonatate (TESSALON) 100 MG capsule [id number] Yes Take 1 capsule (100 mg total) by mouth 2 (two) times daily as needed for cough. Regalado, Belkys A, MD Taking Active   calcium carbonate (SUPER CALCIUM) 1500 (600 Ca) MG TABS tablet [id number] Yes Take 1 tablet by  mouth daily. [provider] Taking Active Self, Spouse/Significant Other, Pharmacy Records  cholecalciferol (VITAMIN D3) 25 MCG (1000 UNIT) tablet [id number] Yes Take 1,000 Units by mouth daily. [provider] Taking Active Self, Spouse/Significant Other, Pharmacy Records  doxycycline (VIBRA-TABS) 100 MG tablet [id number] Yes Take 1 tablet (100 mg total) by mouth every 12 (twelve) hours for 2 days. Regalado, Belkys A, MD Taking Active   FARXIGA 10 MG TABS tablet [id number] No Take 1 tablet (10 mg total) by mouth daily before breakfast.  Patient not taking: Reported on 12/03/2023   Regalado, Belkys A, MD Not Taking Active   furosemide (LASIX) 20 MG tablet [id number] Yes Take 2 tablets (40 mg total) by mouth daily as needed (for more than 2 pounds weight gain in 24.). Regalado, Belkys A, MD Taking Active   guaiFENesin (MUCINEX) 600 MG 12 hr tablet [id number] Yes Take 2 tablets (1,200 mg total) by mouth 2 (two) times daily. Regalado, Belkys A, MD Taking Active   Magnesium (MAGNESIUM COMPLEX HIGH POTENCY) 250 MG CAPS [id number] Yes Take 1 capsule by mouth at bedtime. [provider] Taking Active Self, Spouse/Significant Other, Pharmacy Records  metoprolol succinate (TOPROL XL) 25 MG 24 hr tablet [id number] Yes Take 1 tablet (25 mg total) by mouth daily. Regalado, Belkys A, MD Taking Active             Home Care and Equipment/Supplies: Were Home Health Services Ordered?: NA Any new equipment or medical supplies ordered?: NA  Functional Questionnaire: Do you need assistance with bathing/showering or dressing?: No Do you need assistance with meal preparation?: No Do you need assistance with eating?: No Do you have difficulty maintaining continence: No Do you  need assistance with getting out of bed/getting out of a chair/moving?: No Do you have difficulty managing or taking your medications?: No  Follow up appointments reviewed: PCP Follow-up appointment confirmed?: No  (not [facility] pt) MD Provider Line [phone] Given: No Specialist Hospital Follow-up appointment confirmed?: Yes Date of Specialist follow-up appointment?: 12/07/23 Follow-Up Specialty Provider:: cardio Do you need transportation to your follow-up appointment?: No Do you understand care options if your condition(s) worsen?: Yes-patient verbalized understanding    SIGNATURE Darrall Ellison, LPN [facility] Nurse Health Advisor Direct Dial [phone]

## 2023-12-06 LAB — CULTURE, BLOOD (ROUTINE X 2)
Culture: NO GROWTH
Culture: NO GROWTH
Special Requests: ADEQUATE
Special Requests: ADEQUATE

## 2023-12-07 ENCOUNTER — Ambulatory Visit (HOSPITAL_COMMUNITY)
Admit: 2023-12-07 | Discharge: 2023-12-07 | Disposition: A | Discharge status: Home / Self Care | Attending: Physician Assistant | Admitting: Physician Assistant

## 2023-12-07 ENCOUNTER — Encounter (HOSPITAL_COMMUNITY): Payer: Self-pay | Admitting: Physician Assistant

## 2023-12-07 VITALS — BP 100/76 | HR 109 | Ht 64.0 in | Wt 199.8 lb

## 2023-12-07 DIAGNOSIS — D6869 Other thrombophilia: Secondary | ICD-10-CM

## 2023-12-07 DIAGNOSIS — I5020 Unspecified systolic (congestive) heart failure: Secondary | ICD-10-CM | POA: Diagnosis not present

## 2023-12-07 DIAGNOSIS — I483 Typical atrial flutter: Secondary | ICD-10-CM

## 2023-12-07 DIAGNOSIS — I4819 Other persistent atrial fibrillation: Secondary | ICD-10-CM

## 2023-12-07 DIAGNOSIS — R0683 Snoring: Secondary | ICD-10-CM | POA: Diagnosis not present

## 2023-12-07 DIAGNOSIS — I4892 Unspecified atrial flutter: Secondary | ICD-10-CM

## 2023-12-07 MED ORDER — AMIODARONE HCL 200 MG PO TABS: ORAL_TABLET | ORAL | 0 refills | Status: DC

## 2023-12-07 MED ORDER — POTASSIUM CHLORIDE CRYS ER 20 MEQ PO TBCR: 20.0000 meq | EXTENDED_RELEASE_TABLET | Freq: Two times a day (BID) | ORAL | 1 refills | Status: DC

## 2023-12-07 NOTE — Progress Notes (Signed)
 Primary Care Physician: Bufford Carne, FNP Primary Cardiologist: Maudine Sos, MD Electrophysiologist: None  Referring Physician: Dr Rosary Como [name] is a 68 y.o. female with a history of CHF, atrial flutter, atrial fibrillation who presents for follow up in the [facility].  The patient was initially diagnosed with atrial flutter 11/2023. She presented to the ED 11/28/23 with lightheadedness and palpitations, found to be in atrial flutter. The Friday before presenting to the ED was her initial diagnosis of atrial flutter with her PCP, she was prescribed Eliquis  but had not started yet. During her admission, she was also found to have community acquired pneumonia and a LLE DVT. No PE on CT. She was started on Eliquis  for stroke prevention and DVT. She was also started on metoprolol  for rate control. Echo showed LVEF 40-45%. She underwent TEE guided DCCV on 12/01/23.   Patient presents today for follow up for atrial flutter. She is in atrial fibrillation today. She noticed fluttering and fatigue this past Sunday. Patient does snore and husband reports episodes of apnea. No bleeding issues on anticoagulation.   Today, she denies symptoms of chest pain, shortness of breath, orthopnea, PND, lower extremity edema, dizziness, presyncope, syncope, bleeding, or neurologic sequela. The patient is tolerating medications without difficulties and is otherwise without complaint today.    Atrial Fibrillation Risk Factors:  she does have symptoms or diagnosis of sleep apnea. she does not have a history of rheumatic fever. The patient does not have a history of early familial atrial fibrillation or other arrhythmias.  Atrial Fibrillation Management history:  Previous antiarrhythmic drugs: none Previous cardioversions: 12/01/23 Previous ablations: none Anticoagulation history: Eliquis   ROS- All systems are reviewed and negative except as per the HPI  above.  No past medical history on file.  Current Outpatient Medications  Medication Sig Dispense Refill   amiodarone (PACERONE) 200 MG tablet Take 1 tablet (200 mg total) by mouth 2 (two) times daily for 30 days, THEN 1 tablet (200 mg total) daily. 150 tablet 0   apixaban  (ELIQUIS ) 5 MG TABS tablet Take 2 tablets (10 mg total) by mouth 2 (two) times daily for 3 days, THEN 1 tablet (5 mg total) 2 (two) times daily. 60 tablet 0   B Complex Vitamins (VITAMIN B COMPLEX 100 IJ) Take 1 tablet by mouth daily.     benzonatate  (TESSALON ) 100 MG capsule Take 1 capsule (100 mg total) by mouth 2 (two) times daily as needed for cough. 20 capsule 0   calcium carbonate (SUPER CALCIUM) 1500 (600 Ca) MG TABS tablet Take 1 tablet by mouth daily.     cholecalciferol (VITAMIN D3) 25 MCG (1000 UNIT) tablet Take 1,000 Units by mouth daily.     FARXIGA  10 MG TABS tablet Take 1 tablet (10 mg total) by mouth daily before breakfast. 30 tablet 1   furosemide  (LASIX ) 20 MG tablet Take 2 tablets (40 mg total) by mouth daily as needed (for more than 2 pounds weight gain in 24.). 30 tablet 0   guaiFENesin  (MUCINEX ) 600 MG 12 hr tablet Take 2 tablets (1,200 mg total) by mouth 2 (two) times daily. 60 tablet 0   Magnesium  (MAGNESIUM  COMPLEX HIGH POTENCY) 250 MG CAPS Take 1 capsule by mouth at bedtime.     metoprolol  succinate (TOPROL  XL) 25 MG 24 hr tablet Take 1 tablet (25 mg total) by mouth daily. 30 tablet 11   potassium chloride  SA (KLOR-CON  M) 20 MEQ tablet Take  1 tablet (20 mEq total) by mouth 2 (two) times daily. 90 tablet 1   No current facility-administered medications for this encounter.    Physical Exam: BP 100/76   Pulse (!) 109   Ht 5\' 4"  (1.626 m)   Wt 90.6 kg   BMI 34.30 kg/m   GEN: Well nourished, well developed in no acute distress NECK: No JVD CARDIAC: Irregularly irregular rate and rhythm, no murmurs, rubs, gallops RESPIRATORY:  Clear to auscultation without rales, wheezing or rhonchi  ABDOMEN:  Soft, non-tender, non-distended EXTREMITIES:  No edema; No deformity   Wt Readings from Last 3 Encounters:  12/07/23 90.6 kg  12/01/23 90.7 kg  02/18/23 90.7 kg     EKG today demonstrates  Coarse afib Vent. rate 109 BPM PR interval * ms QRS duration 80 ms QT/QTcB 324/436 ms   Echo 11/29/23 demonstrated   1. Left ventricular ejection fraction, by estimation, is 40 to 45%. The  left ventricle has mildly decreased function. The left ventricle  demonstrates global hypokinesis. Left ventricular diastolic parameters are  indeterminate.   2. Right ventricular systolic function is mildly reduced. The right  ventricular size is normal. Tricuspid regurgitation signal is inadequate  for assessing PA pressure.   3. Left atrial size was mildly dilated.   4. The mitral valve is normal in structure. Trivial mitral valve  regurgitation. No evidence of mitral stenosis. Moderate mitral annular  calcification.   5. The aortic valve is tricuspid. There is mild calcification of the  aortic valve. Aortic valve regurgitation is not visualized. No aortic  stenosis is present.   6. The inferior vena cava is dilated in size with <50% respiratory  variability, suggesting right atrial pressure of 15 mmHg.   7. The patient was in atrial flutter.   8. Technically difficult study with poor acoustic windows.    CHA2DS2-VASc Score = 4  The patient's score is based upon: CHF History: 1 HTN History: 0 Diabetes History: 0 Stroke History: 0 Vascular Disease History: 1 (aortic atherosclerosis) Age Score: 1 Gender Score: 1       ASSESSMENT AND PLAN: Persistent atrial fibrillation/Typical atrial flutter The patient's CHA2DS2-VASc score is 4, indicating a 4.8% annual risk of stroke.   S/p TEE/DCCV 12/01/23 with quick return of atrial fibrillation. Given her reduction in EF, would favor rhythm control strategy. Would avoid class IC and Multaq. Could consider dofetilide, amiodarone, or ablation. She is  agreeable to discussing ablation with EP. Short term, will start amiodarone 200 mg BID x 4 weeks then reduce to once daily as a bridge. If she does not chemically convert, would arrange for DCCV after completing amiodarone loading.  Continue Eliquis  5 mg BID Continue Toprol  25 mg daily  Secondary Hypercoagulable State (ICD10:  D68.69) The patient is at significant risk for stroke/thromboembolism based upon her CHA2DS2-VASc Score of 4.  Continue Apixaban  (Eliquis ). No bleeding issues.   Obesity Body mass index is 34.3 kg/m.  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  GDMT per primary cardiology team Fluid status appears stable today Will start KCL 20 meq daily since she is taking Lasix  and her K+ at discharge was 3.2.  Snoring/witnessed apnea The importance of adequate treatment of sleep apnea was discussed today in order to improve our ability to maintain sinus rhythm long term. Will refer for sleep study.     Follow up with Caitlin Walker as scheduled and then with EP to discuss ablation.  Myrtha Ates PA-C [facility] [address] [place], [place] [address] [phone]

## 2023-12-07 NOTE — Addendum Note (Signed)
 Encounter addended by: Tess Fife, RN on: 12/07/2023 2:08 PM  Actions taken: Order list changed, Diagnosis association updated

## 2023-12-09 DIAGNOSIS — Z6834 Body mass index (BMI) 34.0-34.9, adult: Secondary | ICD-10-CM | POA: Diagnosis not present

## 2023-12-09 DIAGNOSIS — R001 Bradycardia, unspecified: Secondary | ICD-10-CM | POA: Diagnosis not present

## 2023-12-09 DIAGNOSIS — I4892 Unspecified atrial flutter: Secondary | ICD-10-CM | POA: Diagnosis not present

## 2023-12-09 DIAGNOSIS — I5021 Acute systolic (congestive) heart failure: Secondary | ICD-10-CM | POA: Diagnosis not present

## 2023-12-09 DIAGNOSIS — N179 Acute kidney failure, unspecified: Secondary | ICD-10-CM | POA: Diagnosis not present

## 2023-12-09 DIAGNOSIS — I82442 Acute embolism and thrombosis of left tibial vein: Secondary | ICD-10-CM | POA: Diagnosis not present

## 2023-12-09 DIAGNOSIS — J189 Pneumonia, unspecified organism: Secondary | ICD-10-CM | POA: Diagnosis not present

## 2023-12-09 DIAGNOSIS — E876 Hypokalemia: Secondary | ICD-10-CM | POA: Diagnosis not present

## 2023-12-09 DIAGNOSIS — E6609 Other obesity due to excess calories: Secondary | ICD-10-CM | POA: Diagnosis not present

## 2023-12-10 ENCOUNTER — Telehealth (HOSPITAL_COMMUNITY): Payer: Self-pay | Admitting: *Deleted

## 2023-12-10 MED ORDER — AMIODARONE HCL 200 MG PO TABS: 200.0000 mg | ORAL_TABLET | Freq: Every day | ORAL | 1 refills | Status: DC

## 2023-12-10 MED ORDER — METOPROLOL SUCCINATE ER 25 MG PO TB24: 12.5000 mg | ORAL_TABLET | Freq: Every day | ORAL | 2 refills | Status: DC

## 2023-12-10 NOTE — Telephone Encounter (Signed)
 Received notification from primary care office regarding patient with heart rates in the mid-40s and feeling tired. EKG reviewed by Myrtha Ates PA will decrease metoprolol  to 12.5mg  once a day and Amiodarone 200mg  once a day. Pt notified. Has follow up on 5/30. Pt verbalized understanding of instructions.

## 2023-12-17 ENCOUNTER — Encounter (HOSPITAL_BASED_OUTPATIENT_CLINIC_OR_DEPARTMENT_OTHER): Payer: Self-pay | Admitting: Family

## 2023-12-17 ENCOUNTER — Other Ambulatory Visit (HOSPITAL_COMMUNITY): Payer: Self-pay | Admitting: Family Medicine

## 2023-12-17 ENCOUNTER — Ambulatory Visit (HOSPITAL_BASED_OUTPATIENT_CLINIC_OR_DEPARTMENT_OTHER): Admitting: Family

## 2023-12-17 VITALS — BP 108/70 | HR 113 | Ht 64.0 in | Wt 200.9 lb

## 2023-12-17 DIAGNOSIS — D6859 Other primary thrombophilia: Secondary | ICD-10-CM | POA: Diagnosis not present

## 2023-12-17 DIAGNOSIS — E876 Hypokalemia: Secondary | ICD-10-CM

## 2023-12-17 DIAGNOSIS — I4892 Unspecified atrial flutter: Secondary | ICD-10-CM

## 2023-12-17 DIAGNOSIS — I5022 Chronic systolic (congestive) heart failure: Secondary | ICD-10-CM

## 2023-12-17 MED ORDER — EMPAGLIFLOZIN 10 MG PO TABS
10.0000 mg | ORAL_TABLET | Freq: Every day | ORAL | Status: DC
Start: 2023-12-17 — End: 2024-01-11

## 2023-12-17 MED ORDER — APIXABAN 5 MG PO TABS: 5.0000 mg | ORAL_TABLET | Freq: Two times a day (BID) | ORAL | Status: DC

## 2023-12-17 MED ORDER — EMPAGLIFLOZIN 10 MG PO TABS: 10.0000 mg | ORAL_TABLET | Freq: Every day | ORAL | Status: DC

## 2023-12-17 MED ORDER — POTASSIUM CHLORIDE CRYS ER 20 MEQ PO TBCR: 20.0000 meq | EXTENDED_RELEASE_TABLET | Freq: Every day | ORAL | Status: DC

## 2023-12-17 MED ORDER — METOPROLOL SUCCINATE ER 25 MG PO TB24: 25.0000 mg | ORAL_TABLET | Freq: Every day | ORAL | 1 refills | Status: DC

## 2023-12-17 MED ORDER — FUROSEMIDE 20 MG PO TABS: 20.0000 mg | ORAL_TABLET | ORAL | Status: DC | PRN

## 2023-12-17 MED ORDER — EMPAGLIFLOZIN 10 MG PO TABS: 10.0000 mg | ORAL_TABLET | Freq: Every day | ORAL | 2 refills | Status: DC

## 2023-12-17 MED ORDER — APIXABAN 5 MG PO TABS: 5.0000 mg | ORAL_TABLET | Freq: Two times a day (BID) | ORAL | 5 refills | Status: DC

## 2023-12-17 NOTE — Patient Instructions (Addendum)
 Medication Instructions:   CHANGE Metoprolol  to 25mg  one tablet daily  STOP Farxiga   START Jardiance 10mg  daily  *If you need a refill on your cardiac medications before your next appointment, please call your pharmacy*  Lab Work: Your physician recommends that you return for lab work today: CBC, CMET, thyroid panel  If you have labs (blood work) drawn today and your tests are completely normal, you will receive your results only by: MyChart Message (if you have MyChart) OR A paper copy in the mail If you have any lab test that is abnormal or we need to change your treatment, we will call you to review the results.  Testing/Procedures: Your EKG today showed atrial flutter   Follow-Up: At [facility], you and your health needs are our priority.  As part of our continuing mission to provide you with exceptional heart care, our providers are all part of one team.  This team includes your primary Cardiologist (physician) and Advanced Practice Providers or APPs (Physician Assistants and Nurse Practitioners) who all work together to provide you with the care you need, when you need it.  Your next appointment:   As scheduled with Dr. Daneil Dunker on 12/21/23  With Dr. Theodis Fiscal, Clearnce Curia, NP, or Slater Duncan, NP in 2 months  We recommend signing up for the patient portal called "MyChart".  Sign up information is provided on this After Visit Summary.  MyChart is used to connect with patients for Virtual Visits (Telemedicine).  Patients are able to view lab/test results, encounter notes, upcoming appointments, etc.  Non-urgent messages can be sent to your provider as well.   To learn more about what you can do with MyChart, go to [email or web address].   Other Instructions      Ask [employer] about prescription deductible. It should be $250 for the year then your medications will drop to the lower price.  Recommend weighing daily and keeping a log. Please call our office if you  have weight gain of 2 pounds overnight or 5 pounds in 1 week.   Date  Time Weight

## 2023-12-17 NOTE — Progress Notes (Signed)
 Cardiology Office Note   Date:  12/17/2023  ID:  Myla, [name] 1955-11-26, MRN [id number] PCP: Bufford Carne, FNP  [facility] Providers Cardiologist:  Maudine Sos, MD Electrophysiologist:  Ardeen Kohler, MD     History of Present Illness [name] is a 68 y.o. female with history of normocytic anemia, persistent atrial flutter/fibrillation, HFrEF, syncope.  Admission 5/11 - 12/02/2023 after episode of near syncope while at church treated with IV fluid found of paroxysmal atrial flutter and underwent cardioversion 12/01/2023.  Echo reduced LVEF 40 to 45%.  GDMT titration limited by hypotension.  There is also pneumonia on imaging and treated with IV Unasyn  and doxycycline .  Acute DVT of tibial vein of LLE, discharged on Eliquis .  At visit 12/07/2023 with A-fib clinic she was found to be in recurrent atrial fibrillation with onset of fluttering and fatigue the Sunday prior.  Given reduced EF plan for rhythm control.  Short-term amiodarone 200 mg twice daily x 4 weeks then 200mg daily prescribed. Sleep study ordered. Referred to EP to discuss ablation. 12/10/23 due to PCP report of HR 40s with EKG sinus bradycardia, Metoprolol reduced to 12.5mg daily and Amiodarone reduced to 200mg daily.   Discussed the use of AI scribe software for clinical note transcription with the patient, who gave verbal consent to proceed.  History of Present Illness [name] is a 67 year old female with atrial flutter who presents with recurrence of arrhythmia and fluid retention.  She experiences recurrent atrial flutter despite treatment with amiodarone and metoprolol, with symptoms including eye fluttering and low energy levels. Arrhythmia recurred after three days of feeling well. She denies chest pain and maintains stable breathing with breath work exercises.  Taking one tablet of Lasix daily with exception if she is going to be out and about. She missed a dose of  potassium recently and takes one pill a day despite instructions to take two. She reports low energy levels since starting the medication regimen. She also takes Farxiga but reports cost concern. Reviewed Rx deductible for her plan.   Notes issues with constipation which is ongoing. Last BM 2 days ago.    ROS: Please see the history of present illness.    All other systems reviewed and are negative.   Studies Reviewed EKG Interpretation Date/Time:  Friday Dec 17 2023 11:13:01 EDT Ventricular Rate:  113 PR Interval:    QRS Duration:  102 QT Interval:  352 QTC Calculation: 482 R Axis:   -11  Text Interpretation: Atrial flutter with variable A-V block Nonspecific ST abnormality Confirmed by Tiera Mensinger ([id number]) on 12/17/2023 11:18:30 AM    Cardiac Studies & Procedures   ______________________________________________________________________________________________     ECHOCARDIOGRAM  ECHOCARDIOGRAM COMPLETE 11/29/2023  Narrative ECHOCARDIOGRAM REPORT    Patient Name:   [name] Date of Exam: 11/29/2023 Medical Rec #:  [id number]           Height:       64.0 in Accession #:    [id number]          Weight:       200.6 lb Date of Birth:  09/23/1955          BSA:          1.959 m Patient Age:    67 years            BP:           12 1/86 mmHg Patient Gender: F  HR:           97 bpm. Exam Location:  Inpatient  Procedure: 2D Echo, Color Doppler and Cardiac Doppler (Both Spectral and Color Flow Doppler were utilized during procedure).  Indications:    Atrial flutter  History:        Patient has no prior history of Echocardiogram examinations. DVT.  Sonographer:    Juanita Shaw Referring Phys: [id number] VISHAL R PATEL  IMPRESSIONS   1. Left ventricular ejection fraction, by estimation, is 40 to 45%. The left ventricle has mildly decreased function. The left ventricle demonstrates global hypokinesis. Left ventricular diastolic parameters are  indeterminate. 2. Right ventricular systolic function is mildly reduced. The right ventricular size is normal. Tricuspid regurgitation signal is inadequate for assessing PA pressure. 3. Left atrial size was mildly dilated. 4. The mitral valve is normal in structure. Trivial mitral valve regurgitation. No evidence of mitral stenosis. Moderate mitral annular calcification. 5. The aortic valve is tricuspid. There is mild calcification of the aortic valve. Aortic valve regurgitation is not visualized. No aortic stenosis is present. 6. The inferior vena cava is dilated in size with <50% respiratory variability, suggesting right atrial pressure of 15 mmHg. 7. The patient was in atrial flutter. 8. Technically difficult study with poor acoustic windows.  FINDINGS Left Ventricle: Left ventricular ejection fraction, by estimation, is 40 to 45%. The left ventricle has mildly decreased function. The left ventricle demonstrates global hypokinesis. The left ventricular internal cavity size was normal in size. There is no left ventricular hypertrophy. Left ventricular diastolic parameters are indeterminate.  Right Ventricle: The right ventricular size is normal. No increase in right ventricular wall thickness. Right ventricular systolic function is mildly reduced. Tricuspid regurgitation signal is inadequate for assessing PA pressure.  Left Atrium: Left atrial size was mildly dilated.  Right Atrium: Right atrial size was normal in size.  Pericardium: There is no evidence of pericardial effusion.  Mitral Valve: The mitral valve is normal in structure. Moderate mitral annular calcification. Trivial mitral valve regurgitation. No evidence of mitral valve stenosis. MV peak gradient, 5.9 mmHg. The mean mitral valve gradient is 2.0 mmHg.  Tricuspid Valve: The tricuspid valve is normal in structure. Tricuspid valve regurgitation is trivial.  Aortic Valve: The aortic valve is tricuspid. There is mild calcification  of the aortic valve. Aortic valve regurgitation is not visualized. No aortic stenosis is present. Aortic valve mean gradient measures 3.0 mmHg. Aortic valve peak gradient measures 4.9 mmHg. Aortic valve area, by VTI measures 2.96 cm.  Pulmonic Valve: The pulmonic valve was normal in structure. Pulmonic valve regurgitation is trivial.  Aorta: The aortic root is normal in size and structure.  Venous: The inferior vena cava is dilated in size with less than 50% respiratory variability, suggesting right atrial pressure of 15 mmHg.  IAS/Shunts: No atrial level shunt detected by color flow Doppler.   LEFT VENTRICLE PLAX 2D LVIDd:         4.70 cm      Diastology LVIDs:         3.30 cm      LV e' medial:    12.70 cm/s LV PW:         0.70 cm      LV E/e' medial:  8.5 LV IVS:        0.80 cm      LV e' lateral:   12.00 cm/s LVOT diam:     2.00 cm      LV E/e' lateral: 9.0  LV SV:         50 LV SV Index:   25 LVOT Area:     3.14 cm  LV Volumes (MOD) LV vol d, MOD A2C: 116.0 ml LV vol d, MOD A4C: 137.0 ml LV vol s, MOD A2C: 41.3 ml LV vol s, MOD A4C: 59.0 ml LV SV MOD A2C:     74.7 ml LV SV MOD A4C:     137.0 ml LV SV MOD BP:      77.8 ml  RIGHT VENTRICLE             IVC RV Basal diam:  3.00 cm     IVC diam: 2.10 cm RV Mid diam:    1.90 cm RV S prime:     10.10 cm/s TAPSE (M-mode): 2.9 cm  LEFT ATRIUM           Index        RIGHT ATRIUM           Index LA diam:      4.50 cm 2.30 cm/m   RA Area:     11.90 cm LA Vol (A2C): 41.5 ml 21.18 ml/m  RA Volume:   24.00 ml  12.25 ml/m LA Vol (A4C): 61.4 ml 31.34 ml/m AORTIC VALVE                    PULMONIC VALVE AV Area (Vmax):    2.43 cm     PV Vmax:       0.65 m/s AV Area (Vmean):   2.42 cm     PV Peak grad:  1.7 mmHg AV Area (VTI):     2.96 cm AV Vmax:           110.53 cm/s AV Vmean:          78.433 cm/s AV VTI:            0.168 m AV Peak Grad:      4.9 mmHg AV Mean Grad:      3.0 mmHg LVOT Vmax:         85.40 cm/s LVOT  Vmean:        60.500 cm/s LVOT VTI:          0.158 m LVOT/AV VTI ratio: 0.94  AORTA Ao Root diam: 3.30 cm Ao Asc diam:  3.00 cm  MITRAL VALVE MV Area (PHT): 4.01 cm     SHUNTS MV Area VTI:   1.80 cm     Systemic VTI:  0.16 m MV Peak grad:  5.9 mmHg     Systemic Diam: 2.00 cm MV Mean grad:  2.0 mmHg MV Vmax:       1.21 m/s MV Vmean:      69.7 cm/s MV Decel Time: 189 msec MV E velocity: 108.00 cm/s  Dalton McleanMD Electronically signed by Archer Bear Signature Date/Time: 11/29/2023/7:39:26 PM    Final   TEE  ECHO TEE 12/01/2023  Narrative TRANSESOPHOGEAL ECHO REPORT    Patient Name:   Shann Darnel Date of Exam: 12/01/2023 Medical Rec #:  [id number]           Height:       64.0 in Accession #:    [id number]          Weight:       199.9 lb Date of Birth:  June 27, 1956          BSA:          1.956 m Patient  Age:    67 years            BP:           133/85 mmHg Patient Gender: F                   HR:           136 bpm. Exam Location:  Inpatient  Procedure: 3D Echo, Transesophageal Echo, Cardiac Doppler and Color Doppler (Both Spectral and Color Flow Doppler were utilized during procedure).  Indications:     I48.92* Unspecified atrial flutter  History:         Patient has prior history of Echocardiogram examinations, most recent 11/29/2023. CHF, Abnormal ECG, Arrythmias:Atrial Flutter, Signs/Symptoms:Syncope; Risk Factors:Diabetes.  Sonographer:     Raynelle Callow RDCS Referring Phys:  [id number] Carolynne Citron A PARCELLS Diagnosing Phys: Sheryle Donning MD  PROCEDURE: After discussion of the risks and benefits of a TEE, an informed consent was obtained from the patient. The transesophogeal probe was passed without difficulty through the esophogus of the patient. Imaged were obtained with the patient in a left lateral decubitus position. Sedation performed by different physician. The patient was monitored while under deep sedation. Anesthestetic sedation was provided  intravenously by Anesthesiology: 382mg  of Propofol . Image quality was excellent. The patient's vital signs; including heart rate, blood pressure, and oxygen saturation; remained stable throughout the procedure. The patient developed no complications during the procedure. A successful direct current cardioversion was performed at 200 joules with 1 attempt.  IMPRESSIONS   1. Left ventricular ejection fraction, by estimation, is 40 to 45%. The left ventricle has mildly decreased function. The left ventricle demonstrates global hypokinesis. 2. Right ventricular systolic function is mildly reduced. The right ventricular size is normal. 3. Left atrial size was moderately dilated. No left atrial/left atrial appendage thrombus was detected. The LAA emptying velocity was 59 cm/s. 4. The mitral valve is normal in structure. Mild mitral valve regurgitation. No evidence of mitral stenosis. 5. The aortic valve is tricuspid. Aortic valve regurgitation is trivial. No aortic stenosis is present. 6. 3D performed of the LAA and demonstrates 3D of LAA without thrombus.  Conclusion(s)/Recommendation(s): No LA/LAA thrombus identified. Successful cardioversion performed with restoration of normal sinus rhythm.  FINDINGS Left Ventricle: Left ventricular ejection fraction, by estimation, is 40 to 45%. The left ventricle has mildly decreased function. The left ventricle demonstrates global hypokinesis. The left ventricular internal cavity size was normal in size.  Right Ventricle: The right ventricular size is normal. No increase in right ventricular wall thickness. Right ventricular systolic function is mildly reduced.  Left Atrium: Left atrial size was moderately dilated. No left atrial/left atrial appendage thrombus was detected. The LAA emptying velocity was 59 cm/s.  Right Atrium: Right atrial size was normal in size.  Pericardium: Trivial pericardial effusion is present. The pericardial effusion is anterior to  the right ventricle.  Mitral Valve: The mitral valve is normal in structure. Mild mitral valve regurgitation. No evidence of mitral valve stenosis.  Tricuspid Valve: The tricuspid valve is normal in structure. Tricuspid valve regurgitation is mild . No evidence of tricuspid stenosis.  Aortic Valve: The aortic valve is tricuspid. Aortic valve regurgitation is trivial. No aortic stenosis is present.  Pulmonic Valve: The pulmonic valve was grossly normal. Pulmonic valve regurgitation is mild. No evidence of pulmonic stenosis.  Aorta: The aortic root and ascending aorta are structurally normal, with no evidence of dilitation.  IAS/Shunts: No atrial level shunt detected by color flow Doppler.  Additional Comments: Spectral Doppler performed.  Sheryle Donning MD Electronically signed by Sheryle Donning MD Signature Date/Time: 12/01/2023/9:03:58 PM    Final        ______________________________________________________________________________________________      Risk Assessment/Calculations  CHA2DS2-VASc Score = 4   This indicates a 4.8% annual risk of stroke. The patient's score is based upon: CHF History: 1 HTN History: 0 Diabetes History: 0 Stroke History: 0 Vascular Disease History: 1 (aortic atherosclerosis) Age Score: 1 Gender Score: 1            Physical Exam VS:  BP 108/70   Pulse (!) 113   Ht 5\' 4"  (1.626 m)   Wt 200 lb 14.4 oz (91.1 kg)   SpO2 96%   BMI 34.48 kg/m    Wt Readings from Last 3 Encounters:  12/17/23 200 lb 14.4 oz (91.1 kg)  12/07/23 199 lb 12.8 oz (90.6 kg)  12/01/23 199 lb 14.4 oz (90.7 kg)    GEN: Well nourished, well developed in no acute distress NECK: No JVD; No carotid bruits CARDIAC: IRIR, no murmurs, rubs, gallops RESPIRATORY:  Clear to auscultation without rales, wheezing or rhonchi  ABDOMEN: Soft, non-tender, non-distended EXTREMITIES:  No edema; No deformity   ASSESSMENT AND PLAN  Systolic and diastolic heart  failure - Euvolemic and well compensated on exam. Due to better coverage will stop Farxiga  and start Jardiance  10mg  daily. Samples provided. GDMT includes Lasix  40mg  daily, Toprol  25mg  daily, Jardiance  10mg  daily. Given relative hypotension, will defer ARB/ARNI or MRA.  CMET, BNP today. Low sodium diet, fluid restriction <2L, and daily weights encouraged. Educated to contact our office for weight gain of 2 lbs overnight or 5 lbs in one week.  Plan to repeat echo after maintaining NSR and optimized GDMT for ~3 months  Paroxysmal atrial flutter - onset 11/28/23, inpatient DCCV 12/01/23. In AFib clinic 5/20 recurrent atrial fib. PCP visit 5/23 SB 40s, amiodarone  reduce to 200mg  daily and metoprolol  reduced to 12.5mg  daily. Reports she was in NS Rfor 3 days then noted recurrent arrhythmias. Continue amiodarone  200mg  daily Increase Metoprolol  Succinate to 25mg  daily Sleep study already ordered by AFib clinic CBC and thyroid panel today.  Visit upcoming with Dr. Daneil Dunker to discuss ablation  DVT LLE- incidental finding during recent admission. Continue eliquis  5mg  BID.  Hypokalemia - Update CMET. Presently taking potassium 20mEq daily (was originally prescribed BID)  Hypercoagulable state- CHA2DS2-VASc Score = 4 [CHF History: 1, HTN History: 0, Diabetes History: 0, Stroke History: 0, Vascular Disease History: 1 (aortic atherosclerosis), Age Score: 1, Gender Score: 1].  Therefore, the patient's annual risk of stroke is 4.8 %.    Denies bleeding complications. Continue eliquis  5mg  BID. Samples provided.   HLD/aortic atherosclerosis- Not presently on lipid lowering therapy. Not addressed at this clinic visit, discuss at follow up.        Dispo: Follow up as scheduled with Dr. Daneil Dunker 12/21/23 for EP consult. Follow up with general cardiology in 2 months  Signed, Clearnce Curia, NP

## 2023-12-18 LAB — COMPREHENSIVE METABOLIC PANEL WITH GFR
ALT: 19 IU/L (ref 0–32)
AST: 15 IU/L (ref 0–40)
Albumin: 4.4 g/dL (ref 3.9–4.9)
Alkaline Phosphatase: 86 IU/L (ref 44–121)
BUN/Creatinine Ratio: 18 (ref 12–28)
BUN: 16 mg/dL (ref 8–27)
Bilirubin Total: 1.2 mg/dL (ref 0.0–1.2)
CO2: 22 mmol/L (ref 20–29)
Calcium: 9.9 mg/dL (ref 8.7–10.3)
Chloride: 103 mmol/L (ref 96–106)
Creatinine, Ser: 0.9 mg/dL (ref 0.57–1.00)
Globulin, Total: 2.6 g/dL (ref 1.5–4.5)
Glucose: 148 mg/dL — ABNORMAL HIGH (ref 70–99)
Potassium: 4.1 mmol/L (ref 3.5–5.2)
Sodium: 141 mmol/L (ref 134–144)
Total Protein: 7 g/dL (ref 6.0–8.5)
eGFR: 71 mL/min/{1.73_m2} (ref >59)

## 2023-12-18 LAB — THYROID PANEL WITH TSH
Free Thyroxine Index: 3.1 (ref 1.2–4.9)
T3 Uptake Ratio: 28 % (ref 24–39)
T4, Total: 11 ug/dL (ref 4.5–12.0)
TSH: 1.08 u[IU]/mL (ref 0.450–4.500)

## 2023-12-18 LAB — CBC
Hematocrit: 43 % (ref 34.0–46.6)
Hemoglobin: 13.6 g/dL (ref 11.1–15.9)
MCH: 27.3 pg (ref 26.6–33.0)
MCHC: 31.6 g/dL (ref 31.5–35.7)
MCV: 86 fL (ref 79–97)
Platelets: 502 10*3/uL — ABNORMAL HIGH (ref 150–450)
RBC: 4.98 x10E6/uL (ref 3.77–5.28)
RDW: 14.9 % (ref 11.7–15.4)
WBC: 4.6 10*3/uL (ref 3.4–10.8)

## 2023-12-18 LAB — BRAIN NATRIURETIC PEPTIDE: BNP: 91.6 pg/mL (ref 0.0–100.0)

## 2023-12-19 ENCOUNTER — Ambulatory Visit (HOSPITAL_BASED_OUTPATIENT_CLINIC_OR_DEPARTMENT_OTHER): Payer: Self-pay | Admitting: Family

## 2023-12-19 DIAGNOSIS — I5022 Chronic systolic (congestive) heart failure: Secondary | ICD-10-CM

## 2023-12-19 DIAGNOSIS — E876 Hypokalemia: Secondary | ICD-10-CM

## 2023-12-20 MED ORDER — POTASSIUM CHLORIDE CRYS ER 20 MEQ PO TBCR: 20.0000 meq | EXTENDED_RELEASE_TABLET | Freq: Every day | ORAL | 3 refills | Status: DC

## 2023-12-20 MED ORDER — FUROSEMIDE 20 MG PO TABS: 20.0000 mg | ORAL_TABLET | Freq: Every day | ORAL | 3 refills | Status: DC

## 2023-12-21 ENCOUNTER — Encounter: Payer: Self-pay | Admitting: Cardiology

## 2023-12-21 ENCOUNTER — Ambulatory Visit: Attending: Cardiology | Admitting: Cardiology

## 2023-12-21 VITALS — BP 144/78 | HR 99 | Ht 64.0 in | Wt 200.0 lb

## 2023-12-21 DIAGNOSIS — I5022 Chronic systolic (congestive) heart failure: Secondary | ICD-10-CM | POA: Diagnosis not present

## 2023-12-21 DIAGNOSIS — I4892 Unspecified atrial flutter: Secondary | ICD-10-CM

## 2023-12-21 DIAGNOSIS — I4819 Other persistent atrial fibrillation: Secondary | ICD-10-CM

## 2023-12-21 DIAGNOSIS — D6859 Other primary thrombophilia: Secondary | ICD-10-CM | POA: Diagnosis not present

## 2023-12-21 DIAGNOSIS — I483 Typical atrial flutter: Secondary | ICD-10-CM | POA: Diagnosis not present

## 2023-12-21 NOTE — Progress Notes (Signed)
 Electrophysiology Office Note:   Date:  12/22/2023  ID:  [name], DOB [birth year], MRN [id number]  Primary Cardiologist: Maudine Sos, MD Electrophysiologist: Ardeen Kohler, MD      History of Present Illness:   [name] is a 68 y.o. female with h/o normocytic anemia, persistent atrial flutter/fibrillation, chronic systolic heart failure, syncope who is being seen today for evaluation of his atrial fibrillation.   Discussed the use of AI scribe software for clinical note transcription with the patient, who gave verbal consent to proceed.  History of Present Illness Admission 5/11 - 12/02/2023 after episode of near syncope while at church treated with IV fluid found of paroxysmal atrial flutter and underwent cardioversion 12/01/2023. She has been experiencing atrial flutter without noticeable symptoms such as fatigue, shortness of breath, or increased tiredness since the onset. Her heart rhythm issues began with atrial fibrillation noted on an EKG from Dec 07, 2023, transitioning to atrial flutter by Dec 17, 2023.  She has been on amiodarone  since Dec 10, 2023, to manage her heart rhythm. She also takes metoprolol , Jardiance , and Eliquis . Metoprolol  causes sleepiness, but she does not feel any different overall since starting these medications. Her kidney and liver function tests, as well as her thyroid levels, were normal when checked a few weeks ago.  She recalls significant fatigue and dizziness over the past three years, which she now thinks may have been associated with heart rhythm issues. An episode on Mother's Day left her unable to perform usual activities, prompting her to seek medical attention.  She is compliant with her medication regimen, including Eliquis , with no missed doses.  Review of systems complete and found to be negative unless listed in HPI.   EP Information / Studies Reviewed:    EKG is ordered today. Personal review as below.  EKG  Interpretation Date/Time:  Tuesday December 21 2023 14:24:16 EDT Ventricular Rate:  99 PR Interval:    QRS Duration:  94 QT Interval:  358 QTC Calculation: 459 R Axis:   -12  Text Interpretation: Atrial flutter with variable A-V block with premature ventricular or aberrantly conducted complexes T wave abnormality, consider anterolateral ischemia When compared with ECG of 17-Dec-2023 11:13, ST no longer elevated in Inferior leads T wave inversion now evident in Anterolateral leads Confirmed by Ardeen Kohler [phone]) on 12/21/2023 2:58:23 PM   EKG 12/17/23: AFL   EKG 12/07/23: AF   TEE 12/01/23:  1. Left ventricular ejection fraction, by estimation, is 40 to 45%. The  left ventricle has mildly decreased function. The left ventricle  demonstrates global hypokinesis.   2. Right ventricular systolic function is mildly reduced. The right  ventricular size is normal.   3. Left atrial size was moderately dilated. No left atrial/left atrial  appendage thrombus was detected. The LAA emptying velocity was 59 cm/s.   4. The mitral valve is normal in structure. Mild mitral valve  regurgitation. No evidence of mitral stenosis.   5. The aortic valve is tricuspid. Aortic valve regurgitation is trivial.  No aortic stenosis is present.   6. 3D performed of the LAA and demonstrates 3D of LAA without thrombus.   Echo 11/29/23:   1. Left ventricular ejection fraction, by estimation, is 40 to 45%. The  left ventricle has mildly decreased function. The left ventricle  demonstrates global hypokinesis. Left ventricular diastolic parameters are  indeterminate.   2. Right ventricular systolic function is mildly reduced. The right  ventricular size is normal. Tricuspid regurgitation signal is inadequate  for assessing PA pressure.   3. Left atrial size was mildly dilated.   4. The mitral valve is normal in structure. Trivial mitral valve  regurgitation. No evidence of mitral stenosis. Moderate mitral annular   calcification.   5. The aortic valve is tricuspid. There is mild calcification of the  aortic valve. Aortic valve regurgitation is not visualized. No aortic  stenosis is present.   6. The inferior vena cava is dilated in size with <50% respiratory  variability, suggesting right atrial pressure of 15 mmHg.   7. The patient was in atrial flutter.   8. Technically difficult study with poor acoustic windows.   Risk Assessment/Calculations:    CHA2DS2-VASc Score = 4   This indicates a 4.8% annual risk of stroke. The patient's score is based upon: CHF History: 1 HTN History: 0 Diabetes History: 0 Stroke History: 0 Vascular Disease History: 1 (aortic atherosclerosis) Age Score: 1 Gender Score: 1         Physical Exam:   VS:  BP (!) 144/78   Pulse 99   Ht 5\' 4"  (1.626 m)   Wt 200 lb (90.7 kg)   SpO2 98%   BMI 34.33 kg/m    Wt Readings from Last 3 Encounters:  12/21/23 200 lb (90.7 kg)  12/17/23 200 lb 14.4 oz (91.1 kg)  12/07/23 199 lb 12.8 oz (90.6 kg)     GEN: Well nourished, well developed in no acute distress NECK: No JVD CARDIAC: Normal rate, irregular rhythm RESPIRATORY:  Clear to auscultation without rales, wheezing or rhonchi  ABDOMEN: Soft, non-distended EXTREMITIES:  Trace edema; No deformity   ASSESSMENT AND PLAN:    #. Typical atrial flutter, symptomatic: Associated with drop in LVEF. For this reason, we have prioritized a rhythm control strategy. #. Persistent atrial fibrillation, symptomatic: Unknown duration. Diagnosed May 2025, but patient suspects may have been present for much longer.  #. 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 Patient recently started amiodarone . After 2 weeks of loading, we will repeat cardioversion. We will then have her return for a visit with me 2 weeks later to assess symptoms and likely plan for catheter ablation.  - Continue metoprolol  XL  25mg  daily.  - Continue Eliquis  5mg  BID.   #. 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 -Rhythm control as above.  -Continue GDMT - metoprolol  XL 25mg  daily, empagliflozin  10mg  daily - and follow up with primary cardiologist.   Follow up with Dr. Daneil Dunker in 4 weeks  Signed, Ardeen Kohler, MD

## 2023-12-21 NOTE — H&P (View-Only) (Signed)
 Electrophysiology Office Note:   Date:  12/22/2023  ID:  [name], DOB [birth year], MRN [id number]  Primary Cardiologist: Maudine Sos, MD Electrophysiologist: Ardeen Kohler, MD      History of Present Illness:   [name] is a 68 y.o. female with h/o normocytic anemia, persistent atrial flutter/fibrillation, chronic systolic heart failure, syncope who is being seen today for evaluation of his atrial fibrillation.   Discussed the use of AI scribe software for clinical note transcription with the patient, who gave verbal consent to proceed.  History of Present Illness Admission 5/11 - 12/02/2023 after episode of near syncope while at church treated with IV fluid found of paroxysmal atrial flutter and underwent cardioversion 12/01/2023. She has been experiencing atrial flutter without noticeable symptoms such as fatigue, shortness of breath, or increased tiredness since the onset. Her heart rhythm issues began with atrial fibrillation noted on an EKG from Dec 07, 2023, transitioning to atrial flutter by Dec 17, 2023.  She has been on amiodarone  since Dec 10, 2023, to manage her heart rhythm. She also takes metoprolol , Jardiance , and Eliquis . Metoprolol  causes sleepiness, but she does not feel any different overall since starting these medications. Her kidney and liver function tests, as well as her thyroid levels, were normal when checked a few weeks ago.  She recalls significant fatigue and dizziness over the past three years, which she now thinks may have been associated with heart rhythm issues. An episode on Mother's Day left her unable to perform usual activities, prompting her to seek medical attention.  She is compliant with her medication regimen, including Eliquis , with no missed doses.  Review of systems complete and found to be negative unless listed in HPI.   EP Information / Studies Reviewed:    EKG is ordered today. Personal review as below.  EKG  Interpretation Date/Time:  Tuesday December 21 2023 14:24:16 EDT Ventricular Rate:  99 PR Interval:    QRS Duration:  94 QT Interval:  358 QTC Calculation: 459 R Axis:   -12  Text Interpretation: Atrial flutter with variable A-V block with premature ventricular or aberrantly conducted complexes T wave abnormality, consider anterolateral ischemia When compared with ECG of 17-Dec-2023 11:13, ST no longer elevated in Inferior leads T wave inversion now evident in Anterolateral leads Confirmed by Ardeen Kohler [phone]) on 12/21/2023 2:58:23 PM   EKG 12/17/23: AFL   EKG 12/07/23: AF   TEE 12/01/23:  1. Left ventricular ejection fraction, by estimation, is 40 to 45%. The  left ventricle has mildly decreased function. The left ventricle  demonstrates global hypokinesis.   2. Right ventricular systolic function is mildly reduced. The right  ventricular size is normal.   3. Left atrial size was moderately dilated. No left atrial/left atrial  appendage thrombus was detected. The LAA emptying velocity was 59 cm/s.   4. The mitral valve is normal in structure. Mild mitral valve  regurgitation. No evidence of mitral stenosis.   5. The aortic valve is tricuspid. Aortic valve regurgitation is trivial.  No aortic stenosis is present.   6. 3D performed of the LAA and demonstrates 3D of LAA without thrombus.   Echo 11/29/23:   1. Left ventricular ejection fraction, by estimation, is 40 to 45%. The  left ventricle has mildly decreased function. The left ventricle  demonstrates global hypokinesis. Left ventricular diastolic parameters are  indeterminate.   2. Right ventricular systolic function is mildly reduced. The right  ventricular size is normal. Tricuspid regurgitation signal is inadequate  for assessing PA pressure.   3. Left atrial size was mildly dilated.   4. The mitral valve is normal in structure. Trivial mitral valve  regurgitation. No evidence of mitral stenosis. Moderate mitral annular   calcification.   5. The aortic valve is tricuspid. There is mild calcification of the  aortic valve. Aortic valve regurgitation is not visualized. No aortic  stenosis is present.   6. The inferior vena cava is dilated in size with <50% respiratory  variability, suggesting right atrial pressure of 15 mmHg.   7. The patient was in atrial flutter.   8. Technically difficult study with poor acoustic windows.   Risk Assessment/Calculations:    CHA2DS2-VASc Score = 4   This indicates a 4.8% annual risk of stroke. The patient's score is based upon: CHF History: 1 HTN History: 0 Diabetes History: 0 Stroke History: 0 Vascular Disease History: 1 (aortic atherosclerosis) Age Score: 1 Gender Score: 1         Physical Exam:   VS:  BP (!) 144/78   Pulse 99   Ht 5\' 4"  (1.626 m)   Wt 200 lb (90.7 kg)   SpO2 98%   BMI 34.33 kg/m    Wt Readings from Last 3 Encounters:  12/21/23 200 lb (90.7 kg)  12/17/23 200 lb 14.4 oz (91.1 kg)  12/07/23 199 lb 12.8 oz (90.6 kg)     GEN: Well nourished, well developed in no acute distress NECK: No JVD CARDIAC: Normal rate, irregular rhythm RESPIRATORY:  Clear to auscultation without rales, wheezing or rhonchi  ABDOMEN: Soft, non-distended EXTREMITIES:  Trace edema; No deformity   ASSESSMENT AND PLAN:    #. Typical atrial flutter, symptomatic: Associated with drop in LVEF. For this reason, we have prioritized a rhythm control strategy. #. Persistent atrial fibrillation, symptomatic: Unknown duration. Diagnosed May 2025, but patient suspects may have been present for much longer.  #. 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 Patient recently started amiodarone . After 2 weeks of loading, we will repeat cardioversion. We will then have her return for a visit with me 2 weeks later to assess symptoms and likely plan for catheter ablation.  - Continue metoprolol  XL  25mg  daily.  - Continue Eliquis  5mg  BID.   #. 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 -Rhythm control as above.  -Continue GDMT - metoprolol  XL 25mg  daily, empagliflozin  10mg  daily - and follow up with primary cardiologist.   Follow up with Dr. Daneil Dunker in 4 weeks  Signed, Ardeen Kohler, MD

## 2023-12-21 NOTE — Patient Instructions (Signed)
 Medication Instructions:  Your physician recommends that you continue on your current medications as directed. Please refer to the Current Medication list given to you today.  *If you need a refill on your cardiac medications before your next appointment, please call your pharmacy*  Testing/Procedures: Cardioversion  Your physician has recommended that you have a Cardioversion (DCCV). Electrical Cardioversion uses a jolt of electricity to your heart either through paddles or wired patches attached to your chest. This is a controlled, usually prescheduled, procedure. Defibrillation is done under light anesthesia in the hospital, and you usually go home the day of the procedure. This is done to get your heart back into a normal rhythm. You are not awake for the procedure. Please see the instruction sheet given to you today.  Follow-Up: At [facility], you and your health needs are our priority.  As part of our continuing mission to provide you with exceptional heart care, our providers are all part of one team.  This team includes your primary Cardiologist (physician) and Advanced Practice Providers or APPs (Physician Assistants and Nurse Practitioners) who all work together to provide you with the care you need, when you need it.  Your next appointment:   3 weeks  Provider:   Ardeen Kohler, MD

## 2023-12-26 ENCOUNTER — Other Ambulatory Visit: Payer: Self-pay

## 2023-12-26 ENCOUNTER — Emergency Department (HOSPITAL_COMMUNITY)

## 2023-12-26 ENCOUNTER — Emergency Department (HOSPITAL_COMMUNITY): Admission: EM | Admit: 2023-12-26 | Discharge: 2023-12-26 | Disposition: A | Discharge status: Home / Self Care

## 2023-12-26 DIAGNOSIS — Z91010 Allergy to peanuts: Secondary | ICD-10-CM | POA: Diagnosis not present

## 2023-12-26 DIAGNOSIS — Z7901 Long term (current) use of anticoagulants: Secondary | ICD-10-CM | POA: Insufficient documentation

## 2023-12-26 DIAGNOSIS — I4892 Unspecified atrial flutter: Secondary | ICD-10-CM | POA: Diagnosis not present

## 2023-12-26 DIAGNOSIS — Z87891 Personal history of nicotine dependence: Secondary | ICD-10-CM | POA: Insufficient documentation

## 2023-12-26 DIAGNOSIS — R0602 Shortness of breath: Secondary | ICD-10-CM | POA: Diagnosis not present

## 2023-12-26 LAB — COMPREHENSIVE METABOLIC PANEL WITH GFR
ALT: 16 U/L (ref 0–44)
AST: 19 U/L (ref 15–41)
Albumin: 3.8 g/dL (ref 3.5–5.0)
Alkaline Phosphatase: 57 U/L (ref 38–126)
Anion gap: 12 (ref 5–15)
BUN: 21 mg/dL (ref 8–23)
CO2: 22 mmol/L (ref 22–32)
Calcium: 9.5 mg/dL (ref 8.9–10.3)
Chloride: 103 mmol/L (ref 98–111)
Creatinine, Ser: 0.88 mg/dL (ref 0.44–1.00)
GFR, Estimated: >60 mL/min (ref >60)
Glucose, Bld: 137 mg/dL — ABNORMAL HIGH (ref 70–99)
Potassium: 3.4 mmol/L — ABNORMAL LOW (ref 3.5–5.1)
Sodium: 137 mmol/L (ref 135–145)
Total Bilirubin: 1 mg/dL (ref 0.0–1.2)
Total Protein: 7.4 g/dL (ref 6.5–8.1)

## 2023-12-26 LAB — CBC
HCT: 38 % (ref 36.0–46.0)
Hemoglobin: 12.8 g/dL (ref 12.0–15.0)
MCH: 27 pg (ref 26.0–34.0)
MCHC: 33.7 g/dL (ref 30.0–36.0)
MCV: 80.2 fL (ref 80.0–100.0)
Platelets: 435 10*3/uL — ABNORMAL HIGH (ref 150–400)
RBC: 4.74 MIL/uL (ref 3.87–5.11)
RDW: 15.4 % (ref 11.5–15.5)
WBC: 4.7 10*3/uL (ref 4.0–10.5)
nRBC: 0 % (ref 0.0–0.2)

## 2023-12-26 LAB — TROPONIN I (HIGH SENSITIVITY): Troponin I (High Sensitivity): <2 ng/L (ref <18)

## 2023-12-26 LAB — RESP PANEL BY RT-PCR (RSV, FLU A&B, COVID)  RVPGX2
Influenza A by PCR: NEGATIVE
Influenza B by PCR: NEGATIVE
Resp Syncytial Virus by PCR: NEGATIVE
SARS Coronavirus 2 by RT PCR: NEGATIVE

## 2023-12-26 NOTE — ED Provider Triage Note (Signed)
 Emergency Medicine Provider Triage Evaluation Note  [name] , a 67 y.o. female  was evaluated in triage.  Pt complains of dizzy. Report this AM pt felt heart racing, dizzy, lighthead and mild sob.  She called EMS.  She then took her AM medications and now she feels better.  Hx of afib, recently came off Jardiance  and Is scheduled to have cardioversion in 2 days.  No cp  Review of Systems  Positive: As above Negative: As above  Physical Exam  BP (!) 110/93   Pulse (!) 120   Temp 98.6 F (37 C)   Resp 18   Ht 5\' 4"  (1.626 m)   Wt 90.7 kg   SpO2 98%   BMI 34.32 kg/m  Gen:   Awake, no distress   Resp:  Normal effort  MSK:   Moves extremities without difficulty  Other:    Medical Decision Making  Medically screening exam initiated at 11:04 AM.  Appropriate orders placed.  [name] was informed that the remainder of the evaluation will be completed by another provider, this initial triage assessment does not replace that evaluation, and the importance of remaining in the ED until their evaluation is complete.  Afib with RVR, however now improved.  Awaits for next available room.  Nurse aware   [name], PA-C 12/26/23 [phone]

## 2023-12-26 NOTE — ED Triage Notes (Addendum)
 PT here with intermittent SOB and dizziness with afib. Feels tachycardic. Pt has history of afib and gets dizzy sometimes. No neuro symptoms. No cp. Pt is on eliquis 

## 2023-12-26 NOTE — ED Notes (Signed)
 Pa  at bedside.

## 2023-12-26 NOTE — Discharge Instructions (Signed)
 Return to the emergency department if your symptoms worsen or if you experience chest pain, shortness of breath, dizziness, loss of consciousness.  Continue your medications as previously directed, you may take an additional one half (12.5 mg) of your metoprolol  this evening if you remain symptomatic.  Prioritize rest and low-level activity today to keep your symptoms at bay.  Keep your scheduled appointment for your cardioversion on Tuesday, call your cardiologist tomorrow when their office opens to let them know that you were seen in the emergency department today.

## 2023-12-26 NOTE — ED Provider Notes (Signed)
 [place] EMERGENCY DEPARTMENT AT [facility] Provider Note   CSN: [id number] Arrival date & time: 12/26/23  1031     History  Chief Complaint  Patient presents with   Shortness of Breath    [name] is a 68 y.o. female.  68 year old female presenting with shortness of breath.  Patient reports symptoms began around 6 PM last night, were worse this morning prompting her to present to the emergency department.  She describes symptoms of dizziness causing her to "need to sit down", as well as shortness of breath, this is since resolved and she reports feeling well at this time.  Denies chest pain, shortness of breath, lower extremity edema.  Patient scheduled for cardioversion on Tuesday for persistent A-fib.  She is on metoprolol , Eliquis , and Amiodarone , notes that she was told to stop her Jardiance  yesterday and believes that this is what is causing her symptoms today.  History of left lower extremity DVT.   Shortness of Breath      Home Medications Prior to Admission medications   Medication Sig Start Date End Date Taking? Authorizing Provider  amiodarone  (PACERONE ) 200 MG tablet Take 1 tablet (200 mg total) by mouth daily. 12/10/23 01/09/24  Fenton, Clint R, PA  apixaban  (ELIQUIS ) 5 MG TABS tablet Take 1 tablet (5 mg total) by mouth 2 (two) times daily. 12/17/23 06/14/24  Clearnce Curia, NP  apixaban  (ELIQUIS ) 5 MG TABS tablet Take 1 tablet (5 mg total) by mouth 2 (two) times daily. 12/17/23   Walker, Caitlin S, NP  B Complex Vitamins (VITAMIN B COMPLEX 100 IJ) Take 1 tablet by mouth daily.    [provider]  benzonatate  (TESSALON ) 100 MG capsule Take 1 capsule (100 mg total) by mouth 2 (two) times daily as needed for cough. 12/02/23   Regalado, Belkys A, MD  calcium carbonate (SUPER CALCIUM) 1500 (600 Ca) MG TABS tablet Take 600 mg of elemental calcium by mouth daily. With d3 10 mg    [provider]  cholecalciferol (VITAMIN D3) 25 MCG (1000  UNIT) tablet Take 1,000 Units by mouth in the morning.    [provider]  empagliflozin  (JARDIANCE ) 10 MG TABS tablet Take 1 tablet (10 mg total) by mouth daily before breakfast. 12/17/23   Walker, Caitlin S, NP  empagliflozin  (JARDIANCE ) 10 MG TABS tablet Take 1 tablet (10 mg total) by mouth daily before breakfast. 12/17/23   Walker, Caitlin S, NP  empagliflozin  (JARDIANCE ) 10 MG TABS tablet Take 1 tablet (10 mg total) by mouth daily before breakfast. 12/17/23   Walker, Caitlin S, NP  furosemide  (LASIX ) 20 MG tablet Take 1 tablet (20 mg total) by mouth daily. 12/20/23   Clearnce Curia, NP  guaiFENesin  (MUCINEX ) 600 MG 12 hr tablet Take 2 tablets (1,200 mg total) by mouth 2 (two) times daily. Patient not taking: Reported on 12/23/2023 12/02/23   Regalado, Clifford Dam A, MD  Magnesium  (MAGNESIUM  COMPLEX HIGH POTENCY) 250 MG CAPS Take 250 mg by mouth at bedtime.    [provider]  metoprolol  succinate (TOPROL  XL) 25 MG 24 hr tablet Take 1 tablet (25 mg total) by mouth daily. 12/17/23 12/16/24  Walker, Caitlin S, NP  potassium chloride  SA (KLOR-CON  M) 20 MEQ tablet Take 1 tablet (20 mEq total) by mouth daily. Patient taking differently: Take 20 mEq by mouth 2 (two) times daily. 12/20/23   Walker, Caitlin S, NP      Allergies    Peanut (diagnostic) and Coconut (cocos  nucifera)    Review of Systems   Review of Systems  Respiratory:  Positive for shortness of breath.     Physical Exam Updated Vital Signs  Vitals:   12/26/23 1249 12/26/23 1300 12/26/23 1305 12/26/23 1315  BP: 109/86 92/80  102/85  Pulse: 98 (!) 43 (!) 102 99  Resp: 15 16 14 18   Temp: 99.5 F (37.5 C)     TempSrc: Oral     SpO2: 100% 98% 99% 100%  Weight:      Height:        Physical Exam Vitals and nursing note reviewed.  HENT:     Head: Normocephalic.  Eyes:     Extraocular Movements: Extraocular movements intact.     Pupils: Pupils are equal, round, and reactive to light.  Cardiovascular:     Rate and  Rhythm: Tachycardia present. Rhythm irregular.  Pulmonary:     Effort: Pulmonary effort is normal. No tachypnea or respiratory distress.     Breath sounds: Normal breath sounds.  Musculoskeletal:     Cervical back: Normal range of motion.     Right lower leg: No tenderness. No edema.     Left lower leg: No tenderness. No edema.     Comments: Moves all extremities spontaneously without difficulty.  Skin:    General: Skin is warm and dry.  Neurological:     Mental Status: She is alert and oriented to person, place, and time.     ED Results / Procedures / Treatments   Labs (all labs ordered are listed, but only abnormal results are displayed) Labs Reviewed  COMPREHENSIVE METABOLIC PANEL WITH GFR - Abnormal; Notable for the following components:      Result Value   Potassium 3.4 (*)    Glucose, Bld 137 (*)    All other components within normal limits  CBC - Abnormal; Notable for the following components:   Platelets 435 (*)    All other components within normal limits  RESP PANEL BY RT-PCR (RSV, FLU A&B, COVID)  [id number]  TROPONIN I (HIGH SENSITIVITY)    EKG EKG Date/Time:  Sunday December 26 2023 10:45:08 EDT Ventricular Rate:  121 PR Interval:    QRS Duration:  80 QT Interval:  400 QTC Calculation: 568 R Axis:   -5  Text Interpretation:  Critical Test Result: Long QTc Atrial flutter with variable A-V block Nonspecific ST and T wave abnormality Abnormal ECG When compared with ECG of 21-Dec-2023 14:24, PREVIOUS ECG IS PRESENT Confirmed by Elise Guile [phone]) on 12/26/2023 12:13:27 PM  Radiology DG Chest 1 View Result Date: 12/26/2023 CLINICAL DATA:  141880 SOB (shortness of breath) 141880 EXAM: CHEST  1 VIEW COMPARISON:  11/28/2023. FINDINGS: Cardiac silhouette is unremarkable. No pneumothorax or pleural effusion. The lungs are clear. The visualized skeletal structures are unremarkable. IMPRESSION: No acute cardiopulmonary process. Electronically Signed   By: Sydell Eva M.D.    On: 12/26/2023 12:36    Procedures Procedures    Medications Ordered in ED Medications - No data to display  ED Course/ Medical Decision Making/ A&P Clinical Course as of 12/26/23 1333  Sun Dec 26, 2023  1326 Well-appearing on exam.  Heart rate around 100 A-fib/flutter on monitor as interpreted by me.  Symptoms have improved.  Plan to discuss case with cardiology as patient was to be cardioverted in 2 days.  Dispo per cardiology Rex [TY]    Clinical Course User Index [TY] Rolinda Climes, DO  Medical Decision Making This patient presents to the ED for concern of shortness of breath, this involves an extensive number of treatment options, and is a complaint that carries with it a high risk of complications and morbidity.  The differential diagnosis includes atrial fibrillation, atrial flutter, other arrhythmia, CHF exacerbation, ACS.   Co morbidities that complicate the patient evaluation  Afib on Amiodarone /Metoprolol /Eliquis , h/o LE DVT   Additional history obtained:  Additional history obtained from record review External records from outside source obtained and reviewed including cardiology notes   Lab Tests:  I Ordered, and personally interpreted labs.  The pertinent results include: CBC unremarkable, CMP with potassium of 3.4 but otherwise unremarkable.  Viral respiratory panel negative.  Initial troponin less than 2, low suspicion for ACS and will discontinue second troponin.   Imaging Studies ordered:  I ordered imaging studies including CXR  I independently visualized and interpreted imaging which showed No acute cardiopulmonary process. I agree with the radiologist interpretation   Cardiac Monitoring: / EKG:  The patient was maintained on a cardiac monitor.  I personally viewed and interpreted the cardiac monitored which showed an underlying rhythm of: atrial flutter   Consultations Obtained:  I requested consultation with  the cardiologist,  and discussed lab and imaging findings as well as pertinent plan - they recommend: Spoke with Dr. Londa Rival, he reviewed the patient's EKG and previous cardiology notes, he advised that as long as patient is hemodynamically stable and continues to demonstrate symptomatic improvement, cardioversion is not necessary at this time and it is appropriate for her to keep her scheduled appointment for cardioversion on Tuesday.  It appears that patient was recently started on amiodarone , she would likely benefit from continued amiodarone  load prior to cardioversion for best outcome.  She may take an additional one half of her metoprolol  (12.5mg ) this evening if her rate remains tachycardic, she should continue to prioritize rest to avoid exacerbation of her symptoms.   Problem List / ED Course / Critical interventions / Medication management I have reviewed the patients home medicines and have made adjustments as needed   Social Determinants of Health:  Former tobacco use   Test / Admission - Considered:  Upon my physical examination, patient is borderline tachycardic with rates between 95-102 while I am in the room, and she appears to be in atrial flutter, however she is hemodynamically stable and reports symptomatic improvement since her initial presentation to the emergency department.  I spoke with Dr. Londa Rival with cardiology, see above for his recommendations.  At this time patient is appropriate for discharge, I advised her to keep her scheduled appointment for her cardioversion on Tuesday and to call her cardiologist office tomorrow to let them know that she was seen in the emergency department today.  Strict return precautions discussed, I advised patient to return to the emergency department if she experiences worsening chest pain/shortness of breath/dizziness/loss of consciousness.  Patient voiced understanding and is in agreement with this plan.  I advised patient to continue her  current medications as previously directed, per Dr. Anselm Basset recommendation she may take an additional one half (12.5 mg) metoprolol  this evening if she continues to feel symptomatic.  I advised patient to continue to prioritize rest and avoid exertional activities given her symptoms today, she voiced understanding.    Amount and/or Complexity of Data Reviewed Labs: ordered.           Final Clinical Impression(s) / ED Diagnoses Final diagnoses:  Atrial flutter, unspecified type (HCC)  Rx / DC Orders ED Discharge Orders     None         Kendrick Pax, [place] 12/26/23 1432    Rolinda Climes, DO 12/26/23 1521

## 2023-12-27 NOTE — Progress Notes (Signed)
 Pt called for pre procedure instructions. Arrival time 0815 NPO after midnight explained Instructed to take am meds with sip of water and confirmed blood thinner consistency Instructed pt need for ride home tomorrow and have responsible adult with them for 24 hrs post procedure.

## 2023-12-28 ENCOUNTER — Ambulatory Visit (HOSPITAL_COMMUNITY)
Admission: RE | Admit: 2023-12-28 | Discharge: 2023-12-28 | Disposition: A | Discharge status: Home / Self Care | Attending: Cardiovascular Disease | Admitting: Cardiovascular Disease

## 2023-12-28 ENCOUNTER — Ambulatory Visit (HOSPITAL_COMMUNITY): Admitting: Anesthesiology

## 2023-12-28 ENCOUNTER — Encounter (HOSPITAL_COMMUNITY)
Admission: RE | Disposition: A | Discharge status: Home / Self Care | Payer: Self-pay | Source: Home / Self Care | Attending: Cardiovascular Disease

## 2023-12-28 ENCOUNTER — Other Ambulatory Visit: Payer: Self-pay

## 2023-12-28 ENCOUNTER — Encounter (HOSPITAL_COMMUNITY): Payer: Self-pay | Admitting: Cardiovascular Disease

## 2023-12-28 DIAGNOSIS — Z79899 Other long term (current) drug therapy: Secondary | ICD-10-CM | POA: Insufficient documentation

## 2023-12-28 DIAGNOSIS — I5021 Acute systolic (congestive) heart failure: Secondary | ICD-10-CM | POA: Diagnosis not present

## 2023-12-28 DIAGNOSIS — D6869 Other thrombophilia: Secondary | ICD-10-CM | POA: Insufficient documentation

## 2023-12-28 DIAGNOSIS — E669 Obesity, unspecified: Secondary | ICD-10-CM | POA: Diagnosis not present

## 2023-12-28 DIAGNOSIS — I4819 Other persistent atrial fibrillation: Secondary | ICD-10-CM | POA: Diagnosis not present

## 2023-12-28 DIAGNOSIS — Z7901 Long term (current) use of anticoagulants: Secondary | ICD-10-CM | POA: Diagnosis not present

## 2023-12-28 DIAGNOSIS — I4891 Unspecified atrial fibrillation: Secondary | ICD-10-CM | POA: Diagnosis not present

## 2023-12-28 DIAGNOSIS — Z6834 Body mass index (BMI) 34.0-34.9, adult: Secondary | ICD-10-CM | POA: Diagnosis not present

## 2023-12-28 DIAGNOSIS — I483 Typical atrial flutter: Secondary | ICD-10-CM | POA: Insufficient documentation

## 2023-12-28 DIAGNOSIS — J189 Pneumonia, unspecified organism: Secondary | ICD-10-CM

## 2023-12-28 DIAGNOSIS — I5022 Chronic systolic (congestive) heart failure: Secondary | ICD-10-CM | POA: Insufficient documentation

## 2023-12-28 HISTORY — PX: CARDIOVERSION: EP1203

## 2023-12-28 SURGERY — CARDIOVERSION (CATH LAB)
Anesthesia: General

## 2023-12-28 MED ORDER — SODIUM CHLORIDE 0.9 % IV SOLN: INTRAVENOUS | Status: DC

## 2023-12-28 MED ORDER — LIDOCAINE 2% (20 MG/ML) 5 ML SYRINGE
INTRAMUSCULAR | Status: DC | PRN
  Administered 2023-12-28: 40 mg via INTRAVENOUS

## 2023-12-28 MED ORDER — PROPOFOL 10 MG/ML IV BOLUS
INTRAVENOUS | Status: DC | PRN
  Administered 2023-12-28: 50 mg via INTRAVENOUS
  Administered 2023-12-28: 20 mg via INTRAVENOUS

## 2023-12-28 SURGICAL SUPPLY — 1 items: PAD DEFIB RADIO PHYSIO CONN (PAD) ×1 IMPLANT

## 2023-12-28 NOTE — Transfer of Care (Signed)
 Immediate Anesthesia Transfer of Care Note  Patient: [name]  Procedure(s) Performed: CARDIOVERSION  Patient Location: PACU  Anesthesia Type:General  Level of Consciousness: drowsy, patient cooperative, and responds to stimulation  Airway & Oxygen Therapy: Patient Spontanous Breathing and Patient connected to nasal cannula oxygen  Post-op Assessment: Report given to RN and Post -op Vital signs reviewed and stable  Post vital signs: Reviewed and stable  Last Vitals:  Vitals Value Taken Time  BP    Temp    Pulse    Resp    SpO2      Last Pain:  Vitals:   12/28/23 0952  TempSrc:   PainSc: 0-No pain         Complications: No notable events documented.

## 2023-12-28 NOTE — Anesthesia Postprocedure Evaluation (Signed)
 Anesthesia Post Note  Patient: [name]  Procedure(s) Performed: CARDIOVERSION     Patient location during evaluation: PACU Anesthesia Type: General Level of consciousness: awake and alert and oriented Pain management: pain level controlled Vital Signs Assessment: post-procedure vital signs reviewed and stable Respiratory status: spontaneous breathing, nonlabored ventilation and respiratory function stable Cardiovascular status: blood pressure returned to baseline and stable Postop Assessment: no apparent nausea or vomiting Anesthetic complications: no   No notable events documented.  Last Vitals:  Vitals:   12/28/23 1002 12/28/23 1015  BP: 121/84 122/82  Pulse: 63 (!) 53  Resp: 18 16  Temp:    SpO2: 99% 99%    Last Pain:  Vitals:   12/28/23 1030  TempSrc:   PainSc: 0-No pain                 [name] A.

## 2023-12-28 NOTE — CV Procedure (Signed)
 DCC: On Rx eliquis  no missed doses NPO  DCC x 1 250J biphasic Converted from atrial flutter rate 98 to NSR rate 72 with PAC;s  No immediate neurologic sequelae  Janelle Mediate MD [facility]

## 2023-12-28 NOTE — Anesthesia Preprocedure Evaluation (Addendum)
 Anesthesia Evaluation  Patient identified by MRN, date of birth, ID band Patient awake    Reviewed: Allergy & Precautions, NPO status , Patient's Chart, lab work & pertinent test results, reviewed documented beta blocker date and time   History of Anesthesia Complications Negative for: history of anesthetic complications  Airway Mallampati: I  TM Distance: >3 FB Neck ROM: Full    Dental no notable dental hx. (+) Dental Advisory Given   Pulmonary pneumonia, resolved, former smoker   breath sounds clear to auscultation       Cardiovascular (-) angina negative cardio ROS + dysrhythmias Atrial Fibrillation  Rhythm:Irregular Rate:Normal  TEE 12/01/23 1. Left ventricular ejection fraction, by estimation, is 40 to 45%. The  left ventricle has mildly decreased function. The left ventricle  demonstrates global hypokinesis.   2. Right ventricular systolic function is mildly reduced. The right  ventricular size is normal.   3. Left atrial size was moderately dilated. No left atrial/left atrial  appendage thrombus was detected. The LAA emptying velocity was 59 cm/s.   4. The mitral valve is normal in structure. Mild mitral valve  regurgitation. No evidence of mitral stenosis.   5. The aortic valve is tricuspid. Aortic valve regurgitation is trivial.  No aortic stenosis is present.   6. 3D performed of the LAA and demonstrates 3D of LAA without thrombus.   EKG 12/27/23 Long OTc, atrial flutter with variable block 121/min, non specific ST-T abnormalities   Neuro/Psych negative neurological ROS  negative psych ROS   GI/Hepatic negative GI ROS, Neg liver ROS,,,  Endo/Other  negative endocrine ROS  Obesity  Renal/GU Renal InsufficiencyRenal disease  negative genitourinary   Musculoskeletal negative musculoskeletal ROS (+)    Abdominal  (+) + obese  Peds  Hematology Eliquis  therapy- last dose this am   Anesthesia Other Findings    Reproductive/Obstetrics                              Anesthesia Physical Anesthesia Plan  ASA: 3  Anesthesia Plan: General   Post-op Pain Management: Minimal or no pain anticipated   Induction:   PONV Risk Score and Plan: 2 and Treatment may vary due to age or medical condition  Airway Management Planned: Natural Airway and Nasal Cannula  Additional Equipment: None  Intra-op Plan:   Post-operative Plan:   Informed Consent: I have reviewed the patients History and Physical, chart, labs and discussed the procedure including the risks, benefits and alternatives for the proposed anesthesia with the patient or authorized representative who has indicated his/her understanding and acceptance.     Dental advisory given  Plan Discussed with: CRNA and Anesthesiologist  Anesthesia Plan Comments:         Anesthesia Quick Evaluation

## 2023-12-28 NOTE — Interval H&P Note (Signed)
 History and Physical Interval Note:  12/28/2023 8:59 AM  [name] Arley Garant  has presented today for surgery, with the diagnosis of afib.  The various methods of treatment have been discussed with the patient and family. After consideration of risks, benefits and other options for treatment, the patient has consented to  Procedure(s): CARDIOVERSION (N/A) as a surgical intervention.  The patient's history has been reviewed, patient examined, no change in status, stable for surgery.  I have reviewed the patient's chart and labs.  Questions were answered to the patient's satisfaction.     Janelle Mediate

## 2024-01-11 ENCOUNTER — Other Ambulatory Visit: Payer: Self-pay

## 2024-01-11 ENCOUNTER — Ambulatory Visit: Attending: Cardiology | Admitting: Cardiology

## 2024-01-11 ENCOUNTER — Encounter: Payer: Self-pay | Admitting: Cardiology

## 2024-01-11 ENCOUNTER — Telehealth: Payer: Self-pay

## 2024-01-11 VITALS — BP 118/80 | Ht 64.0 in

## 2024-01-11 DIAGNOSIS — I5022 Chronic systolic (congestive) heart failure: Secondary | ICD-10-CM

## 2024-01-11 DIAGNOSIS — D6869 Other thrombophilia: Secondary | ICD-10-CM | POA: Diagnosis not present

## 2024-01-11 DIAGNOSIS — I4819 Other persistent atrial fibrillation: Secondary | ICD-10-CM | POA: Diagnosis not present

## 2024-01-11 DIAGNOSIS — Z79899 Other long term (current) drug therapy: Secondary | ICD-10-CM | POA: Diagnosis not present

## 2024-01-11 DIAGNOSIS — I483 Typical atrial flutter: Secondary | ICD-10-CM | POA: Diagnosis not present

## 2024-01-11 MED ORDER — APIXABAN 5 MG PO TABS: 5.0000 mg | ORAL_TABLET | Freq: Two times a day (BID) | ORAL | 0 refills | Status: AC

## 2024-01-11 NOTE — Patient Instructions (Addendum)
 Medication Instructions:  Your physician recommends that you continue on your current medications as directed. Please refer to the Current Medication list given to you today.  *If you need a refill on your cardiac medications before your next appointment, please call your pharmacy*  Lab Work: BMET and CBC - you may go to any [facility] location to have these drawn within 30 days of your procedure  Testing/Procedures: Cardiac CT Your physician has requested that you have cardiac CT. Cardiac computed tomography (CT) is a painless test that uses an x-ray machine to take clear, detailed pictures of your heart. For further information please visit [email or web address]  We will call you to schedule your CT scan. It will be done about three weeks prior to your ablation.  Ablation Your physician has recommended that you have an ablation. Catheter ablation is a medical procedure used to treat some cardiac arrhythmias (irregular heartbeats). During catheter ablation, a long, thin, flexible tube is put into a blood vessel in your groin (upper thigh), or neck. This tube is called an ablation catheter. It is then guided to your heart through the blood vessel. Radio frequency waves destroy small areas of heart tissue where abnormal heartbeats may cause an arrhythmia to start.   Please call us  back to schedule - 9/24 and 9/26 are open.  Follow-Up: At [facility], you and your health needs are our priority.  As part of our continuing mission to provide you with exceptional heart care, we have created designated Provider Care Teams.  These Care Teams include your primary Cardiologist (physician) and Advanced Practice Providers (APPs -  Physician Assistants and Nurse Practitioners) who all work together to provide you with the care you need, when you need it.   Your next appointment:   We will contact you about your post-procedure follow up appointments.

## 2024-01-11 NOTE — Telephone Encounter (Signed)
 Pt received two week supply samples of Eliquis  5 mg twice daily.

## 2024-01-11 NOTE — Telephone Encounter (Signed)
 Medication name/dosage: Samples List: Eliquis  5 mg  Administration instructions: Take 1 tablet by mouth twice daily  Reason for samples: Reason for samples: unable to afford medication  Ordering provider: Dr. Layton  *Once above information entered, route the phone encounter to CV DIV MAG ST SAMPLES and send Teams message to team member assigned to Samples for the day.

## 2024-01-11 NOTE — Progress Notes (Signed)
 Electrophysiology Office Note:   Date:  01/12/2024  ID:  Pam, Watts 06-11-1956, MRN [id number]  Primary Cardiologist: Annabella Scarce, MD Electrophysiologist: Fonda Kitty, MD      History of Present Illness:   [name] is a 68 y.o. female with h/o normocytic anemia, persistent atrial flutter/fibrillation, chronic systolic heart failure, syncope who is being seen today for evaluation of his atrial fibrillation.  Admission 5/11 - 12/02/2023 after episode of near syncope while at church treated with IV fluid found of paroxysmal atrial flutter and underwent cardioversion 12/01/2023. She has been experiencing atrial flutter without noticeable symptoms such as fatigue, shortness of breath, or increased tiredness since the onset. Her heart rhythm issues began with atrial fibrillation noted on an EKG from Dec 07, 2023, transitioning to atrial flutter by Dec 17, 2023.  Started on amiodarone  and underwent cardioversion on June 10.  Discussed the use of AI scribe software for clinical note transcription with the patient, who gave verbal consent to proceed.  History of Present Illness [name] is a 68 year old female with atrial fibrillation who presents with recurrence of arrhythmia after cardioversion.  She underwent a cardioversion procedure on December 28, 2023, which initially restored her heart to normal rhythm. She felt better immediately after the procedure, noting 'clear eyes' and 'a little more energy'. However, she has experienced a recurrence of atrial fibrillation despite taking amiodarone  as prescribed.  She mentions a stressful event, the death of her sister-in-law, which occurred on a Sunday, possibly contributing to her current condition. Additionally, she describes a physically demanding day involving a long walk and exposure to heat, which may have influenced her heart rhythm.  Her current medications include amiodarone  and Eliquis , with no changes in her  regimen. She has not experienced any bleeding issues associated with Eliquis .  She is concerned about her kidney function after seeing a note about acute kidney injury in her medical records. She has a family history of diabetes, with her sister having diabetes-related complications, but she herself is only pre-diabetic.  She reports that she is otherwise doing relatively well.  She denies orthopnea, worsening shortness of breath, lower extremity edema, chest pain, syncope.  Review of systems complete and found to be negative unless listed in HPI.   EP Information / Studies Reviewed:    EKG is ordered today. Personal review as below.  EKG Interpretation Date/Time:  Tuesday January 11 2024 15:00:59 EDT Ventricular Rate:  86 PR Interval:    QRS Duration:  86 QT Interval:  482 QTC Calculation: 576 R Axis:   28  Text Interpretation: Atrial fibrillation with premature ventricular or aberrantly conducted complexes Artifact When compared with ECG of 28-Dec-2023 10:25, Atrial fibrillation has replaced Sinus rhythm Vent. rate has increased BY  34 BPM Nonspecific T wave abnormality has replaced inverted T waves in Inferior leads Confirmed by Kitty Fonda [id number]) on 01/12/2024 9:57:20 PM   EKG 12/17/23: AFL   EKG 12/07/23: AF   TEE 12/01/23:  1. Left ventricular ejection fraction, by estimation, is 40 to 45%. The  left ventricle has mildly decreased function. The left ventricle  demonstrates global hypokinesis.   2. Right ventricular systolic function is mildly reduced. The right  ventricular size is normal.   3. Left atrial size was moderately dilated. No left atrial/left atrial  appendage thrombus was detected. The LAA emptying velocity was 59 cm/s.   4. The mitral valve is normal in structure. Mild mitral valve  regurgitation. No evidence of  mitral stenosis.   5. The aortic valve is tricuspid. Aortic valve regurgitation is trivial.  No aortic stenosis is present.   6. 3D performed of the  LAA and demonstrates 3D of LAA without thrombus.   Echo 11/29/23:   1. Left ventricular ejection fraction, by estimation, is 40 to 45%. The  left ventricle has mildly decreased function. The left ventricle  demonstrates global hypokinesis. Left ventricular diastolic parameters are  indeterminate.   2. Right ventricular systolic function is mildly reduced. The right  ventricular size is normal. Tricuspid regurgitation signal is inadequate  for assessing PA pressure.   3. Left atrial size was mildly dilated.   4. The mitral valve is normal in structure. Trivial mitral valve  regurgitation. No evidence of mitral stenosis. Moderate mitral annular  calcification.   5. The aortic valve is tricuspid. There is mild calcification of the  aortic valve. Aortic valve regurgitation is not visualized. No aortic  stenosis is present.   6. The inferior vena cava is dilated in size with <50% respiratory  variability, suggesting right atrial pressure of 15 mmHg.   7. The patient was in atrial flutter.   8. Technically difficult study with poor acoustic windows.   Risk Assessment/Calculations:    CHA2DS2-VASc Score = 4   This indicates a 4.8% annual risk of stroke. The patient's score is based upon: CHF History: 1 HTN History: 0 Diabetes History: 0 Stroke History: 0 Vascular Disease History: 1 (aortic atherosclerosis) Age Score: 1 Gender Score: 1         Physical Exam:   VS:  BP 118/80   Ht 5' 4 (1.626 m)   SpO2 94%   BMI 34.67 kg/m    Wt Readings from Last 3 Encounters:  12/28/23 202 lb (91.6 kg)  12/26/23 199 lb 15.3 oz (90.7 kg)  12/21/23 200 lb (90.7 kg)     GEN: Well nourished, well developed in no acute distress NECK: No JVD CARDIAC: Normal rate, irregular rhythm RESPIRATORY:  Clear to auscultation without rales, wheezing or rhonchi  ABDOMEN: Soft, non-distended EXTREMITIES:  Trace edema; No deformity   ASSESSMENT AND PLAN:    #. Persistent atrial fibrillation,  symptomatic: Unknown duration. Diagnosed May 2025, but patient suspects may have been present for much longer.   Associated with drop in LVEF. For this reason, we have prioritized a rhythm control strategy. Underwent cardioversion, now with recurrence within 2 weeks.  Currently rate controlled. #. Typical atrial flutter, symptomatic: #. High risk medication use: Amiodarone .  LFTs normal 12/26/2023.  TSH normal 12/17/2023. -Discussed treatment options today for AF including antiarrhythmic drug therapy and ablation. Discussed risks, recovery and likelihood of success with each treatment strategy. Risk, benefits, and alternatives to EP study and ablation for afib were discussed. These risks include but are not limited to stroke, bleeding, vascular damage, tamponade, perforation, damage to the esophagus, lungs, phrenic nerve and other structures, pulmonary vein stenosis, worsening renal function, coronary vasospasm and death.  Discussed potential need for repeat ablation procedures and antiarrhythmic drugs after an initial ablation. The patient understands these risk and wishes to proceed. We will therefore proceed with catheter ablation at the next available time.  Carto, ICE, anesthesia are requested for the procedure.  Will also obtain CT PV protocol prior to the procedure to exclude LAA thrombus and further evaluate atrial anatomy. - Continue metoprolol  XL 25mg  daily.  - Continue amiodarone  200 mg once daily.  #. 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   #.  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 -Rhythm control as above.  -Continue GDMT - metoprolol  XL 25mg  daily, empagliflozin  10mg  daily - and follow up with primary cardiologist.   Follow up with Dr. Kennyth 3 months after ablation.  Signed, Fonda Kennyth, MD

## 2024-01-27 ENCOUNTER — Other Ambulatory Visit (HOSPITAL_COMMUNITY): Payer: Self-pay | Admitting: Physician Assistant

## 2024-01-27 DIAGNOSIS — E876 Hypokalemia: Secondary | ICD-10-CM

## 2024-02-04 ENCOUNTER — Other Ambulatory Visit (HOSPITAL_COMMUNITY): Payer: Self-pay | Admitting: Cardiology

## 2024-02-11 ENCOUNTER — Other Ambulatory Visit (HOSPITAL_COMMUNITY): Payer: Self-pay

## 2024-02-23 DIAGNOSIS — E66812 Obesity, class 2: Secondary | ICD-10-CM | POA: Diagnosis not present

## 2024-02-23 DIAGNOSIS — R600 Localized edema: Secondary | ICD-10-CM | POA: Diagnosis not present

## 2024-02-23 DIAGNOSIS — K5909 Other constipation: Secondary | ICD-10-CM | POA: Diagnosis not present

## 2024-02-23 DIAGNOSIS — I5021 Acute systolic (congestive) heart failure: Secondary | ICD-10-CM | POA: Diagnosis not present

## 2024-02-23 DIAGNOSIS — Z6835 Body mass index (BMI) 35.0-35.9, adult: Secondary | ICD-10-CM | POA: Diagnosis not present

## 2024-03-01 NOTE — Progress Notes (Signed)
 Cardiology Office Note:    Date:  03/02/2024   ID:  [name], DOB 11/27/1955, MRN [id number]  PCP:  Hint Anthony RAMAN, FNP   [facility] Providers Cardiologist:  Annabella Scarce, MD Electrophysiologist:  Fonda Kitty, MD     Referring MD: Hint Anthony RAMAN, FNP   Chief complaint: Follow-up for recurrent A-fib/flutter  History of Present Illness:    [name] is a 68 y.o. female with a hx of normocytic anemia, persistent atrial flutter/fibrillation, chronic systolic heart failure, syncope who presents to the office today for follow-up for her recurrent atrial fibrillation/flutter.    Patient was admitted to the hospital on 11/28/2023 and discharged after 4 days following an episode of near syncope while at church where she was found to be in atrial flutter. During her admission she was also found to have CAP and a LLE DVT.  Echo on 11/29/2023 LVEF 40-45%, mild decreased LV function, global hypokinesis, mildly reduced RVSF, mildly dilated LA, moderate mitral annular calcification, trivial regurg, and the peak gradient, 5.9 mmHg, with mean valve gradient of 2.0 mmHg.  Mild calcification of the aortic valve, no stenosis present, aortic valve mean gradient measures 3.0 mmHg, peak gradient measures 4.9 mmHg, valve area by VTI measures 2.96 cm.  TEE was performed on 12-01-2023 that showed no LA/LAA thrombus, patient was subsequently cardioverted, started on Eliquis , and discharged.  She had a follow-up visit with the A-fib clinic on 12/07/2023 where she was found to be in recurrent atrial fibrillation.  She was initially started on short-term amiodarone  200 mg twice daily x 4 weeks then 200 mg daily, sleep study was ordered.  On 12/10/2023 her PCP reported HR in the 40s, EKG sinus bradycardia, metoprolol  was reduced to 12.5 mg daily and amiodarone  reduced to 200 mg daily.  Patient went to the emergency department on 12/26/2023 with complaints of shortness of breath and dizziness  causing her to feel the need to sit down.  Heart rate ranged from 43-102 in the ED, symptoms resolved spontaneously, cardiology was consulted and instructed the patient may take half her metoprolol  dose if she continued to feel symptomatic later that evening following discharge.  Potassium was 3.4, other labs WNL, no meds were given and the patient was discharged home.  She followed up 2 days later for her scheduled cardioversion, which proceeded as planned.  She had a follow-up visit on January 11, 2024, where patient was found to be in atrial fibrillation with a rate of 86 that was felt to be well-controlled on her amiodarone  and metoprolol .  Ablation procedure, including risks and benefits, were discussed at this time, which the patient wished to proceed with.  A CT PV W/ CA scoring was ordered, does not appear to have been completed.  Patient was evaluated at [facility] on February 04, 2024, where she reported significant swelling and weight gain which she believed to be due to fluid retention and decreased activity.  She was instructed to continue her Jardiance  and Lasix  therapy at that time.  Also given instructions for monitoring for fluid overload signs and symptoms, as well as compression stocking therapy.  Labs performed by their office on 02/24/2024 showed potassium of 4.0, BUN 14, creatinine 0.85, normal AST and ALT.  In office today patient is doing well from a cardiovascular standpoint.  Denies chest pain, shortness of breath, cough, nausea, dizziness, near-syncope, dark or bloody stools. Does report bilateral lower extremity edema that persists despite Lasix .  Patient denies use  of compression stockings but says she does try to elevate her legs when she can.  Does report occasional tingling in her feet, denies it being worse since starting amiodarone .  Also reports difficulties with her vision and blurred vision, but states this has been ongoing for years, unchanged since starting amiodarone .   Patient has been taking her medicines without issue.  She has reported increased fatigue, usually worse after taking her daily metoprolol , but states she understands the significance of the drug in controlling her heart rate, and is willing to continue it until her ablation procedure moving forward.  Will recheck her thyroid  panel.  Patient is not currently on any medications for her cholesterol, her 10 year ASCVD risk score is 5.1%.  Patient also complained of left groin pain, she believes it is secondary to where we  went through my leg back in May, but states she is unsure whether she is remembering her procedure or her husband's procedure. Described is not present at rest, only comes on with movements, particularly bending over, not tender to palpation in clinic.  No palpable or pulsatile masses, 2+ femoral pulse, no palpable thrill, left leg warm, dry, good coloration. Unable to find any procedural log or notations on procedures requiring left femoral arterial access in her chart.     Past Surgical History:  Procedure Laterality Date   ABDOMINAL HYSTERECTOMY     CARDIOVERSION N/A 12/01/2023   Procedure: CARDIOVERSION;  Surgeon: Lonni Slain, MD;  Location: [facility] INVASIVE CV LAB;  Service: Cardiovascular;  Laterality: N/A;   CARDIOVERSION N/A 12/28/2023   Procedure: CARDIOVERSION;  Surgeon: Delford Maude BROCKS, MD;  Location: [facility] INVASIVE CV LAB;  Service: Cardiovascular;  Laterality: N/A;   CHOLECYSTECTOMY     TRANSESOPHAGEAL ECHOCARDIOGRAM (CATH LAB) N/A 12/01/2023   Procedure: TRANSESOPHAGEAL ECHOCARDIOGRAM;  Surgeon: Lonni Slain, MD;  Location: [facility] INVASIVE CV LAB;  Service: Cardiovascular;  Laterality: N/A;    Current Medications: Current Meds  Medication Sig   amiodarone  (PACERONE ) 200 MG tablet TAKE 1 TABLET BY MOUTH 2 (TWO) TIMES DAILY FOR 30 DAYS, THEN 1 TABLET (200 MG TOTAL) DAILY   apixaban  (ELIQUIS ) 5 MG TABS tablet Take 1 tablet (5 mg total) by mouth 2 (two) times  daily.   B Complex Vitamins (VITAMIN B COMPLEX 100 IJ) Take 1 tablet by mouth daily.   calcium carbonate (SUPER CALCIUM) 1500 (600 Ca) MG TABS tablet Take 600 mg of elemental calcium by mouth daily. With d3 10 mg   cholecalciferol (VITAMIN D3) 25 MCG (1000 UNIT) tablet Take 1,000 Units by mouth in the morning.   empagliflozin  (JARDIANCE ) 10 MG TABS tablet Take 1 tablet (10 mg total) by mouth daily before breakfast.   Magnesium  (MAGNESIUM  COMPLEX HIGH POTENCY) 250 MG CAPS Take 250 mg by mouth at bedtime.   metoprolol  succinate (TOPROL  XL) 25 MG 24 hr tablet Take 1 tablet (25 mg total) by mouth daily.   potassium chloride  SA (KLOR-CON  M20) 20 MEQ tablet TAKE 1 TABLET BY MOUTH TWICE A DAY   [DISCONTINUED] furosemide  (LASIX ) 20 MG tablet Take 1 tablet (20 mg total) by mouth daily.     Allergies:   Peanut (diagnostic) and Coconut (cocos nucifera)   Social History   Socioeconomic History   Marital status: Married    Spouse name: Not on file   Number of children: 2   Years of education: Not on file   Highest education level: Associate degree: occupational, Scientist, product/process development, or vocational program  Occupational History   Not on  file  Tobacco Use   Smoking status: Former    Current packs/day: 0.00    Types: Cigarettes    Quit date: 2013    Years since quitting: 12.6   Smokeless tobacco: Never   Tobacco comments:    Former smoker 12/07/23  Vaping Use   Vaping status: Never Used  Substance and Sexual Activity   Alcohol use: Not Currently   Drug use: Never   Sexual activity: Not Currently  Other Topics Concern   Not on file  Social History Narrative   Not on file   Social Drivers of Health   Financial Resource Strain: Not on file  Food Insecurity: No Food Insecurity (11/29/2023)   Hunger Vital Sign    Worried About Running Out of Food in the Last Year: Never true    Ran Out of Food in the Last Year: Never true  Transportation Needs: No Transportation Needs (11/29/2023)   PRAPARE -  Administrator, Civil Service (Medical): No    Lack of Transportation (Non-Medical): No  Physical Activity: Inactive (10/14/2017)   Exercise Vital Sign    Days of Exercise per Week: 0 days    Minutes of Exercise per Session: 0 min  Stress: Stress Concern Present (10/14/2017)   [employer] of Occupational Health - Occupational Stress Questionnaire    Feeling of Stress : Very much  Social Connections: Socially Integrated (11/29/2023)   Social Connection and Isolation Panel    Frequency of Communication with Friends and Family: More than three times a week    Frequency of Social Gatherings with Friends and Family: Once a week    Attends Religious Services: More than 4 times per year    Active Member of [employer] or Organizations: Yes    Attends Engineer, structural: More than 4 times per year    Marital Status: Married     Family History: The patient's family history includes Cancer in her paternal grandfather; Diabetes in her brother, father, and sister; Hypertension in her brother and mother.  ROS:   Please see the history of present illness.     All other systems reviewed and are negative. EKGs/Labs/Other Studies Reviewed:    The following studies were reviewed today:      Recent Labs: 12/01/2023: Magnesium  1.8 12/17/2023: BNP 91.6; TSH 1.080 12/26/2023: ALT 16; BUN 21; Creatinine, Ser 0.88; Hemoglobin 12.8; Platelets 435; Potassium 3.4; Sodium 137  Recent Lipid Panel No results found for: CHOL, TRIG, HDL, CHOLHDL, VLDL, LDLCALC, LDLDIRECT   Risk Assessment/Calculations:    CHA2DS2-VASc Score = 4   This indicates a 4.8% annual risk of stroke. The patient's score is based upon: CHF History: 1 HTN History: 0 Diabetes History: 0 Stroke History: 0 Vascular Disease History: 1 (aortic atherosclerosis) Age Score: 1 Gender Score: 1     ASCVD (Atherosclerotic Cardiovascular Disease) 2013 Risk Calculator from AHA/ACC from StatOfficial.[email or web address].za  on  03/02/2024 ** All calculations should be rechecked by clinician prior to use **  RESULT SUMMARY:   Moderate- to high-intensity statin recommended because 10-year risk >7.5%  To view statin dosages by intensity, see Evidence section.  9.9%   Risk of cardiovascular event (coronary or stroke death or non-fatal MI or stroke) in next 10 years. Moderate- to high-intensity statin recommended because 10-year risk >7.5%  4.9%   10-year cardiovascular risk if risk factors were optimal.   INPUTS: Age --> 67 years Diabetes --> 0 = No Sex --> 0 = Female Smoker --> 1 = Yes  Total cholesterol --> 191 mg/dL HDL cholesterol --> 46 mg/dL Systolic blood pressure --> 106 mm Hg Treatment for hypertension --> 0 = No Race --> 2 = African American        Physical Exam:    VS:  BP 106/62 (BP Location: Left Arm, Patient Position: Sitting, Cuff Size: Large)   Pulse (!) 57   Ht 5' 4 (1.626 m)   Wt 205 lb 9.6 oz (93.3 kg)   SpO2 99%   BMI 35.29 kg/m     Wt Readings from Last 3 Encounters:  03/02/24 205 lb 9.6 oz (93.3 kg)  12/28/23 202 lb (91.6 kg)  12/26/23 199 lb 15.3 oz (90.7 kg)    GEN:  Well nourished, well developed in no acute distress HEENT: Normal NECK:  No carotid bruits CARDIAC: S1-S2 normal, irregularly irregular, no murmurs, rubs, gallops. +1 bilateral LE edema around ankles RESPIRATORY:  Clear to auscultation without rales, wheezing or rhonchi  ABDOMEN: Soft, non-tender, non-distended MUSCULOSKELETAL: No deformity  SKIN: Warm and dry NEUROLOGIC:  Alert and oriented x 3 PSYCHIATRIC:  Normal affect   ASSESSMENT:    1. On amiodarone  therapy   2. Persistent atrial fibrillation (HCC)   3. Hypercoagulable state due to persistent atrial fibrillation (HCC)   4. Chronic systolic heart failure (HCC)   5. Mixed hyperlipidemia    PLAN:    In order of problems listed above:  Persistent atrial fibrillation/flutter -Proceed with workup for ablation,  -Patient on board with  continuing metoprolol  for rate control until her ablation procedure. 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  -Will consider re-ordering echo to reassess LVEF following ablation therapy  5.   Hyperlipidemia, >=50% reduction in LDL-C from baseline and an absolute LDL-C level of <70 mg/dL - Check fasting lipid panel for baseline today - Due to potential for interaction between amiodarone  increasing blood levels of statins and upcoming ablation therapy, considering lack of chest pain, SOB, or other symptoms concerning for ischemia, it is reasonable to wait to start statin therapy until after ablation therapy     Medication Adjustments/Labs and Tests Ordered: Current medicines are reviewed at length with the patient today.  Concerns regarding medicines are outlined above.  Orders Placed This Encounter  Procedures   Basic metabolic panel with GFR   Thyroid  Panel With TSH   Lipid panel   Meds ordered this encounter  Medications   furosemide  (LASIX ) 40 MG tablet    Sig: Take 1 tablet (40 mg total) by mouth daily.    Dispense:  90 tablet    Refill:  1    Supervising Provider:   LONNI SLAIN [[id number]]    Patient  Instructions  Medication Instructions:  CHANGE Fursosemide (Lasix ) to 40mg  daily help with swelling  *If you need a refill on your  cardiac medications before your next appointment, please call your pharmacy*  Lab Work: Your physician recommends that you return for lab work in: 7-10 days BMET, lipid panel, thyroid  panel   If you have labs (blood work) drawn today and your tests are completely normal, you will receive your results only by: MyChart Message (if you have MyChart) OR A paper copy in the mail If you have any lab test that is abnormal or we need to change your treatment, we will call you to review the results.  Follow-Up: At [facility], you and your health needs are our priority.  As part of our continuing mission to provide you with exceptional heart care, our providers are all part of one team.  This team includes your primary Cardiologist (physician) and Advanced Practice Providers or APPs (Physician Assistants and Nurse Practitioners) who all work together to provide you with the care you need, when you need it.  Your next appointment:   4 month(s)  Provider:   Reche GORMAN Finder, NP   We recommend signing up for the patient portal called MyChart.  Sign up information is provided on this After Visit Summary.  MyChart is used to connect with patients for Virtual Visits (Telemedicine).  Patients are able to view lab/test results, encounter notes, upcoming appointments, etc.  Non-urgent messages can be sent to your provider as well.   To learn more about what you can do with MyChart, go to [email or web address].   Other Instructions       To prevent or reduce lower extremity swelling: Eat a low salt diet. Salt makes the body hold onto extra fluid which causes swelling. Sit with legs elevated. For example, in the recliner or on an ottoman.  Wear knee-high compression stockings during the daytime. Ones labeled 15-20 mmHg provide good compression.     Signed, Miriam FORBES Shams, NP  03/02/2024 4:36 PM    [facility]

## 2024-03-02 ENCOUNTER — Other Ambulatory Visit (HOSPITAL_BASED_OUTPATIENT_CLINIC_OR_DEPARTMENT_OTHER): Payer: Self-pay

## 2024-03-02 ENCOUNTER — Encounter (HOSPITAL_BASED_OUTPATIENT_CLINIC_OR_DEPARTMENT_OTHER): Payer: Self-pay | Admitting: Nurse Practitioner

## 2024-03-02 ENCOUNTER — Ambulatory Visit (INDEPENDENT_AMBULATORY_CARE_PROVIDER_SITE_OTHER): Admitting: Emergency Medicine

## 2024-03-02 VITALS — BP 106/62 | HR 57 | Ht 64.0 in | Wt 205.6 lb

## 2024-03-02 DIAGNOSIS — D6869 Other thrombophilia: Secondary | ICD-10-CM | POA: Diagnosis not present

## 2024-03-02 DIAGNOSIS — E782 Mixed hyperlipidemia: Secondary | ICD-10-CM

## 2024-03-02 DIAGNOSIS — I5022 Chronic systolic (congestive) heart failure: Secondary | ICD-10-CM

## 2024-03-02 DIAGNOSIS — I4819 Other persistent atrial fibrillation: Secondary | ICD-10-CM

## 2024-03-02 DIAGNOSIS — Z79899 Other long term (current) drug therapy: Secondary | ICD-10-CM

## 2024-03-02 MED ORDER — FUROSEMIDE 40 MG PO TABS
40.0000 mg | ORAL_TABLET | Freq: Every day | ORAL | 1 refills | Status: AC
Start: 2024-03-02 — End: ?
  Filled 2024-03-02: qty 90, 90d supply, fill #0
  Filled 2024-06-01: qty 90, 90d supply, fill #1

## 2024-03-02 NOTE — Patient Instructions (Signed)
 Medication Instructions:  CHANGE Fursosemide (Lasix ) to 40mg  daily help with swelling  *If you need a refill on your cardiac medications before your next appointment, please call your pharmacy*  Lab Work: Your physician recommends that you return for lab work in: 7-10 days BMET, lipid panel, thyroid  panel   If you have labs (blood work) drawn today and your tests are completely normal, you will receive your results only by: MyChart Message (if you have MyChart) OR A paper copy in the mail If you have any lab test that is abnormal or we need to change your treatment, we will call you to review the results.  Follow-Up: At [facility], you and your health needs are our priority.  As part of our continuing mission to provide you with exceptional heart care, our providers are all part of one team.  This team includes your primary Cardiologist (physician) and Advanced Practice Providers or APPs (Physician Assistants and Nurse Practitioners) who all work together to provide you with the care you need, when you need it.  Your next appointment:   4 month(s)  Provider:   Reche GORMAN Finder, NP   We recommend signing up for the patient portal called MyChart.  Sign up information is provided on this After Visit Summary.  MyChart is used to connect with patients for Virtual Visits (Telemedicine).  Patients are able to view lab/test results, encounter notes, upcoming appointments, etc.  Non-urgent messages can be sent to your provider as well.   To learn more about what you can do with MyChart, go to [email or web address].   Other Instructions       To prevent or reduce lower extremity swelling: Eat a low salt diet. Salt makes the body hold onto extra fluid which causes swelling. Sit with legs elevated. For example, in the recliner or on an ottoman.  Wear knee-high compression stockings during the daytime. Ones labeled 15-20 mmHg provide good compression.

## 2024-03-13 DIAGNOSIS — Z79899 Other long term (current) drug therapy: Secondary | ICD-10-CM | POA: Diagnosis not present

## 2024-03-13 DIAGNOSIS — E782 Mixed hyperlipidemia: Secondary | ICD-10-CM | POA: Diagnosis not present

## 2024-03-13 DIAGNOSIS — I5022 Chronic systolic (congestive) heart failure: Secondary | ICD-10-CM | POA: Diagnosis not present

## 2024-03-14 ENCOUNTER — Ambulatory Visit: Payer: Self-pay | Admitting: Nurse Practitioner

## 2024-03-14 LAB — BASIC METABOLIC PANEL WITH GFR
BUN/Creatinine Ratio: 16 (ref 12–28)
BUN: 15 mg/dL (ref 8–27)
CO2: 30 mmol/L — ABNORMAL HIGH (ref 20–29)
Calcium: 9.8 mg/dL (ref 8.7–10.3)
Chloride: 102 mmol/L (ref 96–106)
Creatinine, Ser: 0.93 mg/dL (ref 0.57–1.00)
Glucose: 102 mg/dL — ABNORMAL HIGH (ref 70–99)
Potassium: 3.9 mmol/L (ref 3.5–5.2)
Sodium: 144 mmol/L (ref 134–144)
eGFR: 68 mL/min/1.73 (ref >59)

## 2024-03-14 LAB — LIPID PANEL
Chol/HDL Ratio: 3 ratio (ref 0.0–4.4)
Cholesterol, Total: 199 mg/dL (ref 100–199)
HDL: 66 mg/dL (ref >39)
LDL Chol Calc (NIH): 125 mg/dL — ABNORMAL HIGH (ref 0–99)
Triglycerides: 42 mg/dL (ref 0–149)
VLDL Cholesterol Cal: 8 mg/dL (ref 5–40)

## 2024-03-14 LAB — THYROID PANEL WITH TSH
Free Thyroxine Index: 3.6 (ref 1.2–4.9)
T3 Uptake Ratio: 30 % (ref 24–39)
T4, Total: 12 ug/dL (ref 4.5–12.0)
TSH: 0.805 u[IU]/mL (ref 0.450–4.500)

## 2024-03-15 ENCOUNTER — Ambulatory Visit: Payer: Self-pay | Admitting: Emergency Medicine

## 2024-03-24 ENCOUNTER — Telehealth: Payer: Self-pay | Admitting: Cardiology

## 2024-03-24 ENCOUNTER — Other Ambulatory Visit: Payer: Self-pay | Admitting: Family Medicine

## 2024-03-24 DIAGNOSIS — Z1231 Encounter for screening mammogram for malignant neoplasm of breast: Secondary | ICD-10-CM

## 2024-03-24 NOTE — Telephone Encounter (Signed)
  Patient is calling to confirm appt for ablation procedure. She said she was given 09/24 or 09/26 by Dr. Kennyth

## 2024-03-24 NOTE — Telephone Encounter (Signed)
 Spoke with the patient and advised that those dates are no longer available. I have scheduled her for 11/20. She is aware that we will be in touch closer to that date with further instructions.

## 2024-03-29 ENCOUNTER — Other Ambulatory Visit (HOSPITAL_BASED_OUTPATIENT_CLINIC_OR_DEPARTMENT_OTHER): Payer: Self-pay | Admitting: Family

## 2024-03-29 DIAGNOSIS — I5022 Chronic systolic (congestive) heart failure: Secondary | ICD-10-CM

## 2024-04-06 ENCOUNTER — Ambulatory Visit (HOSPITAL_BASED_OUTPATIENT_CLINIC_OR_DEPARTMENT_OTHER)
Admission: RE | Admit: 2024-04-06 | Discharge: 2024-04-06 | Disposition: A | Discharge status: Home / Self Care | Payer: Self-pay | Source: Ambulatory Visit | Attending: Family Medicine | Admitting: Family Medicine

## 2024-04-06 DIAGNOSIS — E2839 Other primary ovarian failure: Secondary | ICD-10-CM | POA: Insufficient documentation

## 2024-04-06 DIAGNOSIS — Z780 Asymptomatic menopausal state: Secondary | ICD-10-CM | POA: Diagnosis not present

## 2024-04-12 ENCOUNTER — Ambulatory Visit
Admission: RE | Admit: 2024-04-12 | Discharge: 2024-04-12 | Disposition: A | Discharge status: Home / Self Care | Source: Ambulatory Visit | Attending: Family Medicine | Admitting: Family Medicine

## 2024-04-12 ENCOUNTER — Ambulatory Visit

## 2024-04-12 DIAGNOSIS — Z1231 Encounter for screening mammogram for malignant neoplasm of breast: Secondary | ICD-10-CM | POA: Diagnosis not present

## 2024-05-11 ENCOUNTER — Telehealth: Payer: Self-pay

## 2024-05-11 NOTE — Telephone Encounter (Signed)
-----   Message from Nurse Carlyle C sent at 03/24/2024  5:04 PM EDT ----- Regarding: 06/08/24 afib ablation Precert:  MD: Kennyth Type of ablation: A-fib Diagnosis: A-fib CPT code: A-fib (06343) Ablation scheduled (date/time): 11/20 at 10:00am  Procedure:  Added to calendar? Yes Orders entered? Yes Letter complete? No, >30 days before procedure Scheduled with cath lab? Yes Any medications to hold? Yes (please list hold instructions): Jardiance  3 days Labs ordered (CBC, BMET, PT/INR if on warfarin): Yes Mapping system: Doesn't matter CARTO/OPAL rep notified? No Cardiac CT needed? Yes, ordered Dye allergy? No Pre-meds ordered and instructions given? No, >30 days before procedure Letter method: MyChart H&P: 6/24 Device: No  Follow-up:  Cassie/Angel, please schedule Routine.  Covering RN - please send this message to [employer], EP scheduler, EP Scheduling pool, EP [employer], and CT scheduler ([place]/Stephanie Mogg), if indicated.

## 2024-05-16 ENCOUNTER — Encounter (INDEPENDENT_AMBULATORY_CARE_PROVIDER_SITE_OTHER): Payer: Self-pay

## 2024-05-18 ENCOUNTER — Ambulatory Visit (HOSPITAL_COMMUNITY)
Admission: RE | Admit: 2024-05-18 | Discharge: 2024-05-18 | Disposition: A | Discharge status: Home / Self Care | Source: Ambulatory Visit | Attending: Cardiology | Admitting: Cardiology

## 2024-05-18 DIAGNOSIS — I517 Cardiomegaly: Secondary | ICD-10-CM | POA: Diagnosis not present

## 2024-05-18 DIAGNOSIS — I4819 Other persistent atrial fibrillation: Secondary | ICD-10-CM | POA: Diagnosis not present

## 2024-05-18 DIAGNOSIS — I2510 Atherosclerotic heart disease of native coronary artery without angina pectoris: Secondary | ICD-10-CM | POA: Diagnosis not present

## 2024-05-18 MED ORDER — IOHEXOL 350 MG/ML SOLN
80.0000 mL | Freq: Once | INTRAVENOUS | Status: AC | PRN
  Administered 2024-05-18: 80 mL via INTRAVENOUS

## 2024-05-19 ENCOUNTER — Encounter (HOSPITAL_COMMUNITY): Payer: Self-pay

## 2024-05-19 ENCOUNTER — Telehealth (HOSPITAL_COMMUNITY): Payer: Self-pay

## 2024-05-19 NOTE — Telephone Encounter (Signed)
 Attempted to reach patient to discuss upcoming procedure, no answer. Left VM for patient to return call.

## 2024-05-22 NOTE — Telephone Encounter (Signed)
 Spoke with patient to complete pre-procedure call.     Health status review:  Any new medical conditions, recent signs of acute illness or been started on antibiotics? No Any recent hospitalizations or surgeries? No Any new medications started since pre-op visit? No  Follow all medication instructions prior to procedure or the procedure may be rescheduled:    Continue taking Eliquis  (Apixaban ) twice daily without missing any doses before procedure. Essential chronic medications:  No medication should be continued, unless told otherwise.  HOLD: Empagliflozin  (Jardiance ) for 3 days prior to the procedure. Last dose on Sunday, November 16.  On the morning of your procedure DO NOT take any medication., including Eliquis  (Apixaban ).  Nothing to eat or drink after midnight prior to your procedure.  Pre-procedure testing scheduled: CT completed and lab work by November 12, patient is out of town and this will be the earliest date possible.  Confirmed patient is scheduled for Atrial Fibrillation Ablation on Thursday, November 20 with Dr. Dr. Kennyth. Instructed patient to arrive at the Main Entrance A at [facility]: [address] [place], [place] [address] and check in at Admitting at 7:30 AM.  Plan to go home the same day, you will only stay overnight if medically necessary. You MUST have a responsible adult to drive you home and MUST be with you the first 24 hours after you arrive home or your procedure could be cancelled.  Informed a nurse may call a day before the procedure to confirm arrival time and ensure instructions are followed.  Patient verbalized understanding to information provided and is agreeable to proceed with procedure.   Advised to contact RN Navigator at [phone], to inform of any new medications started after call or concerns prior to procedure.

## 2024-05-25 DIAGNOSIS — H52209 Unspecified astigmatism, unspecified eye: Secondary | ICD-10-CM | POA: Diagnosis not present

## 2024-05-25 DIAGNOSIS — H5213 Myopia, bilateral: Secondary | ICD-10-CM | POA: Diagnosis not present

## 2024-06-01 ENCOUNTER — Other Ambulatory Visit (HOSPITAL_BASED_OUTPATIENT_CLINIC_OR_DEPARTMENT_OTHER): Payer: Self-pay

## 2024-06-01 DIAGNOSIS — I4819 Other persistent atrial fibrillation: Secondary | ICD-10-CM | POA: Diagnosis not present

## 2024-06-01 LAB — CBC
Hematocrit: 45.9 % (ref 34.0–46.6)
Hemoglobin: 14.5 g/dL (ref 11.1–15.9)
MCH: 26.9 pg (ref 26.6–33.0)
MCHC: 31.6 g/dL (ref 31.5–35.7)
MCV: 85 fL (ref 79–97)
Platelets: 397 x10E3/uL (ref 150–450)
RBC: 5.39 x10E6/uL — ABNORMAL HIGH (ref 3.77–5.28)
RDW: 15 % (ref 11.7–15.4)
WBC: 3.8 x10E3/uL (ref 3.4–10.8)

## 2024-06-01 LAB — BASIC METABOLIC PANEL WITH GFR
BUN/Creatinine Ratio: 23 (ref 12–28)
BUN: 18 mg/dL (ref 8–27)
CO2: 24 mmol/L (ref 20–29)
Calcium: 9.9 mg/dL (ref 8.7–10.3)
Chloride: 101 mmol/L (ref 96–106)
Creatinine, Ser: 0.79 mg/dL (ref 0.57–1.00)
Glucose: 104 mg/dL — ABNORMAL HIGH (ref 70–99)
Potassium: 4 mmol/L (ref 3.5–5.2)
Sodium: 141 mmol/L (ref 134–144)
eGFR: 82 mL/min/1.73 (ref >59)

## 2024-06-02 ENCOUNTER — Other Ambulatory Visit (HOSPITAL_BASED_OUTPATIENT_CLINIC_OR_DEPARTMENT_OTHER): Payer: Self-pay

## 2024-06-02 ENCOUNTER — Ambulatory Visit: Payer: Self-pay | Admitting: Cardiology

## 2024-06-02 MED ORDER — EMPAGLIFLOZIN 10 MG PO TABS
10.0000 mg | ORAL_TABLET | Freq: Every day | ORAL | 3 refills | Status: AC
  Filled 2024-06-03 – 2024-07-04 (×3): qty 90, 90d supply, fill #0

## 2024-06-02 MED ORDER — FUROSEMIDE 20 MG PO TABS: 20.0000 mg | ORAL_TABLET | Freq: Every day | ORAL | 3 refills | Status: DC

## 2024-06-02 MED ORDER — APIXABAN 5 MG PO TABS
5.0000 mg | ORAL_TABLET | Freq: Two times a day (BID) | ORAL | 5 refills | Status: DC
  Filled 2024-06-06: qty 60, 30d supply, fill #0

## 2024-06-02 MED ORDER — AMIODARONE HCL 200 MG PO TABS
200.0000 mg | ORAL_TABLET | Freq: Every day | ORAL | 1 refills | Status: DC
  Filled 2024-06-03 – 2024-06-07 (×2): qty 90, 90d supply, fill #0

## 2024-06-02 MED ORDER — METOPROLOL SUCCINATE ER 25 MG PO TB24
12.5000 mg | ORAL_TABLET | Freq: Every day | ORAL | 2 refills | Status: AC
  Filled 2024-06-02 – 2024-06-14 (×5): qty 45, 90d supply, fill #0
  Filled 2024-07-04: qty 45, 90d supply, fill #1

## 2024-06-02 MED ORDER — FUROSEMIDE 20 MG PO TABS: 20.0000 mg | ORAL_TABLET | Freq: Every day | ORAL | 1 refills | Status: DC | PRN

## 2024-06-02 MED ORDER — POTASSIUM CHLORIDE CRYS ER 20 MEQ PO TBCR: 20.0000 meq | EXTENDED_RELEASE_TABLET | Freq: Two times a day (BID) | ORAL | 3 refills | Status: DC

## 2024-06-02 MED ORDER — POTASSIUM CHLORIDE ER 20 MEQ PO TBCR: 20.0000 meq | EXTENDED_RELEASE_TABLET | Freq: Every day | ORAL | 3 refills | Status: AC

## 2024-06-02 MED ORDER — DAPAGLIFLOZIN PROPANEDIOL 10 MG PO TABS: 10.0000 mg | ORAL_TABLET | Freq: Every day | ORAL | 1 refills | Status: DC

## 2024-06-02 MED ORDER — APIXABAN 5 MG PO TABS
5.0000 mg | ORAL_TABLET | Freq: Two times a day (BID) | ORAL | 3 refills | Status: DC
  Filled 2024-06-03 – 2024-06-07 (×2): qty 180, 90d supply, fill #0

## 2024-06-04 ENCOUNTER — Other Ambulatory Visit (HOSPITAL_BASED_OUTPATIENT_CLINIC_OR_DEPARTMENT_OTHER): Payer: Self-pay

## 2024-06-05 ENCOUNTER — Other Ambulatory Visit: Payer: Self-pay

## 2024-06-06 ENCOUNTER — Other Ambulatory Visit (HOSPITAL_BASED_OUTPATIENT_CLINIC_OR_DEPARTMENT_OTHER): Payer: Self-pay

## 2024-06-06 ENCOUNTER — Other Ambulatory Visit: Payer: Self-pay

## 2024-06-07 ENCOUNTER — Other Ambulatory Visit (HOSPITAL_BASED_OUTPATIENT_CLINIC_OR_DEPARTMENT_OTHER): Payer: Self-pay

## 2024-06-07 NOTE — Pre-Procedure Instructions (Signed)
 Instructed patient on the following items: Arrival time 0730 Nothing to eat or drink after midnight No meds AM of procedure Responsible person to drive you home and stay with you for 24 hrs  Have you missed any doses of anti-coagulant Eliquis - takes twice a day, hasn't missed any doses in last 4 weeks.  Don't take dose morning of procedure.

## 2024-06-08 ENCOUNTER — Other Ambulatory Visit (HOSPITAL_COMMUNITY): Payer: Self-pay

## 2024-06-08 ENCOUNTER — Ambulatory Visit (HOSPITAL_COMMUNITY): Admitting: Certified Registered Nurse Anesthetist

## 2024-06-08 ENCOUNTER — Other Ambulatory Visit (HOSPITAL_BASED_OUTPATIENT_CLINIC_OR_DEPARTMENT_OTHER): Payer: Self-pay

## 2024-06-08 ENCOUNTER — Ambulatory Visit (HOSPITAL_COMMUNITY)
Admission: RE | Admit: 2024-06-08 | Discharge: 2024-06-08 | Disposition: A | Discharge status: Home / Self Care | Attending: Cardiology | Admitting: Cardiology

## 2024-06-08 ENCOUNTER — Other Ambulatory Visit: Payer: Self-pay

## 2024-06-08 ENCOUNTER — Ambulatory Visit (HOSPITAL_COMMUNITY)
Admission: RE | Disposition: A | Discharge status: Home / Self Care | Payer: Self-pay | Source: Home / Self Care | Attending: Cardiology

## 2024-06-08 DIAGNOSIS — Z87891 Personal history of nicotine dependence: Secondary | ICD-10-CM

## 2024-06-08 DIAGNOSIS — I4891 Unspecified atrial fibrillation: Secondary | ICD-10-CM

## 2024-06-08 DIAGNOSIS — I5021 Acute systolic (congestive) heart failure: Secondary | ICD-10-CM | POA: Diagnosis not present

## 2024-06-08 DIAGNOSIS — I483 Typical atrial flutter: Secondary | ICD-10-CM

## 2024-06-08 DIAGNOSIS — I110 Hypertensive heart disease with heart failure: Secondary | ICD-10-CM

## 2024-06-08 DIAGNOSIS — I5022 Chronic systolic (congestive) heart failure: Secondary | ICD-10-CM | POA: Diagnosis not present

## 2024-06-08 DIAGNOSIS — I10 Essential (primary) hypertension: Secondary | ICD-10-CM | POA: Diagnosis not present

## 2024-06-08 DIAGNOSIS — D6869 Other thrombophilia: Secondary | ICD-10-CM | POA: Insufficient documentation

## 2024-06-08 DIAGNOSIS — I4819 Other persistent atrial fibrillation: Secondary | ICD-10-CM | POA: Diagnosis not present

## 2024-06-08 HISTORY — PX: ATRIAL FIBRILLATION ABLATION: EP1191

## 2024-06-08 LAB — POCT ACTIVATED CLOTTING TIME
Activated Clotting Time: 279 s
Activated Clotting Time: 348 s

## 2024-06-08 SURGERY — ATRIAL FIBRILLATION ABLATION
Anesthesia: General

## 2024-06-08 MED ORDER — PROTAMINE SULFATE 10 MG/ML IV SOLN
INTRAVENOUS | Status: DC | PRN
  Administered 2024-06-08: 40 mg via INTRAVENOUS

## 2024-06-08 MED ORDER — MIDAZOLAM HCL (PF) 2 MG/2ML IJ SOLN
INTRAMUSCULAR | Status: DC | PRN
Start: 2024-06-08 — End: 2024-06-08
  Administered 2024-06-08: 2 mg via INTRAVENOUS

## 2024-06-08 MED ORDER — SODIUM CHLORIDE 0.9 % IV SOLN: INTRAVENOUS | Status: DC

## 2024-06-08 MED ORDER — DEXAMETHASONE SOD PHOSPHATE PF 10 MG/ML IJ SOLN
INTRAMUSCULAR | Status: DC | PRN
Start: 2024-06-08 — End: 2024-06-08
  Administered 2024-06-08: 10 mg via INTRAVENOUS

## 2024-06-08 MED ORDER — AMIODARONE HCL 200 MG PO TABS
200.0000 mg | ORAL_TABLET | Freq: Every day | ORAL | 0 refills | Status: DC
  Filled 2024-06-08 (×2): qty 30, 30d supply, fill #0

## 2024-06-08 MED ORDER — HEPARIN SODIUM (PORCINE) 1000 UNIT/ML IJ SOLN
INTRAMUSCULAR | Status: AC
  Filled 2024-06-08: qty 10

## 2024-06-08 MED ORDER — ROCURONIUM BROMIDE 10 MG/ML (PF) SYRINGE
PREFILLED_SYRINGE | INTRAVENOUS | Status: DC | PRN
  Administered 2024-06-08 (×3): 20 mg via INTRAVENOUS
  Administered 2024-06-08: 60 mg via INTRAVENOUS

## 2024-06-08 MED ORDER — ONDANSETRON HCL 4 MG/2ML IJ SOLN
INTRAMUSCULAR | Status: DC | PRN
  Administered 2024-06-08: 4 mg via INTRAVENOUS

## 2024-06-08 MED ORDER — HEPARIN SODIUM (PORCINE) 1000 UNIT/ML IJ SOLN
INTRAMUSCULAR | Status: DC | PRN
  Administered 2024-06-08: 1000 [IU] via INTRAVENOUS

## 2024-06-08 MED ORDER — HEPARIN (PORCINE) IN NACL 1000-0.9 UT/500ML-% IV SOLN
INTRAVENOUS | Status: DC | PRN
  Administered 2024-06-08 (×3): 500 mL

## 2024-06-08 MED ORDER — LIDOCAINE 2% (20 MG/ML) 5 ML SYRINGE
INTRAMUSCULAR | Status: DC | PRN
Start: 2024-06-08 — End: 2024-06-08
  Administered 2024-06-08: 40 mg via INTRAVENOUS

## 2024-06-08 MED ORDER — SODIUM CHLORIDE 0.9% FLUSH: 3.0000 mL | Freq: Two times a day (BID) | INTRAVENOUS | Status: DC

## 2024-06-08 MED ORDER — ATROPINE SULFATE 1 MG/ML IV SOLN
INTRAVENOUS | Status: DC | PRN
  Administered 2024-06-08: 1 mg via INTRAVENOUS

## 2024-06-08 MED ORDER — ACETAMINOPHEN 325 MG PO TABS: 650.0000 mg | ORAL_TABLET | ORAL | Status: DC | PRN

## 2024-06-08 MED ORDER — NITROGLYCERIN 1 MG/10 ML FOR IR/CATH LAB
INTRA_ARTERIAL | Status: DC | PRN
  Administered 2024-06-08: 20 mL via INTRAVENOUS

## 2024-06-08 MED ORDER — ONDANSETRON HCL 4 MG/2ML IJ SOLN
4.0000 mg | Freq: Four times a day (QID) | INTRAMUSCULAR | Status: DC | PRN
  Administered 2024-06-08: 4 mg via INTRAVENOUS

## 2024-06-08 MED ORDER — PHENYLEPHRINE 80 MCG/ML (10ML) SYRINGE FOR IV PUSH (FOR BLOOD PRESSURE SUPPORT)
PREFILLED_SYRINGE | INTRAVENOUS | Status: DC | PRN
Start: 2024-06-08 — End: 2024-06-08
  Administered 2024-06-08: 160 ug via INTRAVENOUS

## 2024-06-08 MED ORDER — PHENYLEPHRINE HCL-NACL 20-0.9 MG/250ML-% IV SOLN
INTRAVENOUS | Status: DC | PRN
Start: 2024-06-08 — End: 2024-06-08
  Administered 2024-06-08: 30 ug/min via INTRAVENOUS

## 2024-06-08 MED ORDER — APIXABAN 5 MG PO TABS
5.0000 mg | ORAL_TABLET | Freq: Once | ORAL | Status: AC
  Administered 2024-06-08: 5 mg via ORAL
  Filled 2024-06-08: qty 1

## 2024-06-08 MED ORDER — SODIUM CHLORIDE 0.9 % IV SOLN: 250.0000 mL | INTRAVENOUS | Status: DC | PRN

## 2024-06-08 MED ORDER — ATROPINE SULFATE 1 MG/10ML IJ SOSY
PREFILLED_SYRINGE | INTRAMUSCULAR | Status: AC
  Filled 2024-06-08: qty 10

## 2024-06-08 MED ORDER — PROPOFOL 10 MG/ML IV BOLUS
INTRAVENOUS | Status: DC | PRN
  Administered 2024-06-08: 150 mg via INTRAVENOUS

## 2024-06-08 MED ORDER — FENTANYL CITRATE (PF) 100 MCG/2ML IJ SOLN
INTRAMUSCULAR | Status: AC
  Filled 2024-06-08: qty 2

## 2024-06-08 MED ORDER — NITROGLYCERIN 1 MG/10 ML FOR IR/CATH LAB
INTRA_ARTERIAL | Status: AC
  Filled 2024-06-08: qty 20

## 2024-06-08 MED ORDER — SUGAMMADEX SODIUM 200 MG/2ML IV SOLN
INTRAVENOUS | Status: DC | PRN
  Administered 2024-06-08: 200 mg via INTRAVENOUS

## 2024-06-08 MED ORDER — ONDANSETRON HCL 4 MG/2ML IJ SOLN
INTRAMUSCULAR | Status: AC
  Filled 2024-06-08: qty 2

## 2024-06-08 MED ORDER — HEPARIN SODIUM (PORCINE) 1000 UNIT/ML IJ SOLN
INTRAMUSCULAR | Status: DC | PRN
  Administered 2024-06-08: 4000 [IU] via INTRAVENOUS
  Administered 2024-06-08: 5000 [IU] via INTRAVENOUS
  Administered 2024-06-08: 12000 [IU] via INTRAVENOUS

## 2024-06-08 MED ORDER — FENTANYL CITRATE (PF) 250 MCG/5ML IJ SOLN
INTRAMUSCULAR | Status: DC | PRN
  Administered 2024-06-08 (×2): 50 ug via INTRAVENOUS

## 2024-06-08 MED ORDER — SODIUM CHLORIDE 0.9% FLUSH: 3.0000 mL | INTRAVENOUS | Status: DC | PRN

## 2024-06-08 MED ORDER — MIDAZOLAM HCL 2 MG/2ML IJ SOLN
INTRAMUSCULAR | Status: AC
  Filled 2024-06-08: qty 2

## 2024-06-08 SURGICAL SUPPLY — 19 items
BLANKET WARM UNDERBOD FULL ACC (MISCELLANEOUS) ×1 IMPLANT
CABLE FARASTAR GEN2 SNGL USE (CABLE) IMPLANT
CATH BI DIR 7FR CS F-J 12 PIN (CATHETERS) IMPLANT
CATH FARAWAVE 2.0 31 (CATHETERS) IMPLANT
CATH GE 8FR SOUNDSTAR (CATHETERS) IMPLANT
CATH OCTARAY 2.0 F 3-3-3-3-3 (CATHETERS) IMPLANT
CATH SMTCH THERMOCOOL SF DF (CATHETERS) IMPLANT
CLOSURE PERCLOSE PROSTYLE (Vascular Products) IMPLANT
COVER SWIFTLINK CONNECTOR (BAG) ×1 IMPLANT
DILATOR VESSEL 38 20CM 16FR (INTRODUCER) IMPLANT
GUIDEWIRE INQWIRE 1.5J.035X260 (WIRE) IMPLANT
KIT VERSACROSS CNCT FARADRIVE (KITS) IMPLANT
PACK EP LF (CUSTOM PROCEDURE TRAY) ×1 IMPLANT
PAD DEFIB RADIO PHYSIO CONN (PAD) ×1 IMPLANT
PATCH CARTO3 (PAD) IMPLANT
SHEATH FARADRIVE STEERABLE (SHEATH) IMPLANT
SHEATH PINNACLE 8F 10CM (SHEATH) IMPLANT
SHEATH PINNACLE 9F 10CM (SHEATH) IMPLANT
TUBING SMART ABLATE COOLFLOW (TUBING) IMPLANT

## 2024-06-08 NOTE — Anesthesia Preprocedure Evaluation (Addendum)
 Anesthesia Evaluation  Patient identified by MRN, date of birth, ID band Patient awake    Reviewed: Allergy & Precautions, NPO status , Patient's Chart, lab work & pertinent test results, reviewed documented beta blocker date and time   History of Anesthesia Complications Negative for: history of anesthetic complications  Airway Mallampati: II  TM Distance: >3 FB Neck ROM: Full    Dental no notable dental hx. (+) Dental Advisory Given   Pulmonary former smoker   breath sounds clear to auscultation       Cardiovascular hypertension, Pt. on medications and Pt. on home beta blockers + DOE  + dysrhythmias Atrial Fibrillation  Rhythm:Irregular Rate:Normal  TEE 12/01/23 1. Left ventricular ejection fraction, by estimation, is 40 to 45%. The  left ventricle has mildly decreased function. The left ventricle  demonstrates global hypokinesis.   2. Right ventricular systolic function is mildly reduced. The right  ventricular size is normal.   3. Left atrial size was moderately dilated. No left atrial/left atrial  appendage thrombus was detected. The LAA emptying velocity was 59 cm/s.   4. The mitral valve is normal in structure. Mild mitral valve  regurgitation. No evidence of mitral stenosis.   5. The aortic valve is tricuspid. Aortic valve regurgitation is trivial.  No aortic stenosis is present.   6. 3D performed of the LAA and demonstrates 3D of LAA without thrombus.   EKG 12/27/23 Long OTc, atrial flutter with variable block 121/min, non specific ST-T abnormalities   Neuro/Psych negative neurological ROS     GI/Hepatic negative GI ROS, Neg liver ROS,,,  Endo/Other  Obesity  Renal/GU Renal InsufficiencyRenal disease     Musculoskeletal negative musculoskeletal ROS (+)    Abdominal  (+) + obese  Peds  Hematology Eliquis     Anesthesia Other Findings   Reproductive/Obstetrics                               Anesthesia Physical Anesthesia Plan  ASA: 3  Anesthesia Plan: General   Post-op Pain Management: Tylenol  PO (pre-op)* and Minimal or no pain anticipated   Induction: Intravenous  PONV Risk Score and Plan: 3 and Treatment may vary due to age or medical condition, Dexamethasone and Ondansetron   Airway Management Planned: Oral ETT  Additional Equipment: None  Intra-op Plan:   Post-operative Plan: Extubation in OR  Informed Consent: I have reviewed the patients History and Physical, chart, labs and discussed the procedure including the risks, benefits and alternatives for the proposed anesthesia with the patient or authorized representative who has indicated his/her understanding and acceptance.     Dental advisory given  Plan Discussed with: CRNA and Anesthesiologist  Anesthesia Plan Comments:         Anesthesia Quick Evaluation

## 2024-06-08 NOTE — Discharge Instructions (Addendum)
 Stop amiodarone  in 1 month.Femoral Site Care This sheet gives you information about how to care for yourself after your procedure. Your health care provider may also give you more specific instructions. If you have problems or questions, contact your health care provider. What can I expect after the procedure?  After the procedure, it is common to have: Bruising that usually fades within 1-2 weeks. Tenderness at the site. Follow these instructions at home: Wound care Follow instructions from your health care provider about how to take care of your insertion site. Make sure you: Wash your hands with soap and water before you change your bandage (dressing). If soap and water are not available, use hand sanitizer. Remove your dressing as told by your health care provider. 24 hours Do not take baths, swim, or use a hot tub until your health care provider approves. You may shower 24-48 hours after the procedure or as told by your health care provider. Gently wash the site with plain soap and water. Pat the area dry with a clean towel. Do not rub the site. This may cause bleeding. Do not apply powder or lotion to the site. Keep the site clean and dry. Check your femoral site every day for signs of infection. Check for: Redness, swelling, or pain. Fluid or blood. Warmth. Pus or a bad smell. Activity For the first 2-3 days after your procedure, or as long as directed: Avoid climbing stairs as much as possible. Do not squat. Do not lift anything that is heavier than 10 lb (4.5 kg), or the limit that you are told, until your health care provider says that it is safe. For 5 days Rest as directed. Avoid sitting for a long time without moving. Get up to take short walks every 1-2 hours. Do not drive for 24 hours if you were given a medicine to help you relax (sedative). General instructions Take over-the-counter and prescription medicines only as told by your health care provider. Keep all  follow-up visits as told by your health care provider. This is important. Contact a health care provider if you have: A fever or chills. You have redness, swelling, or pain around your insertion site. Get help right away if: The catheter insertion area swells very fast. You pass out. You suddenly start to sweat or your skin gets clammy. The catheter insertion area is bleeding, and the bleeding does not stop when you hold steady pressure on the area. The area near or just beyond the catheter insertion site becomes pale, cool, tingly, or numb. These symptoms may represent a serious problem that is an emergency. Do not wait to see if the symptoms will go away. Get medical help right away. Call your local emergency services (911 in the [place] Do not drive yourself to the hospital. Summary After the procedure, it is common to have bruising that usually fades within 1-2 weeks. Check your femoral site every day for signs of infection. Do not lift anything that is heavier than 10 lb (4.5 kg), or the limit that you are told, until your health care provider says that it is safe. This information is not intended to replace advice given to you by your health care provider. Make sure you discuss any questions you have with your health care provider. Document Revised: 07/19/2017 Document Reviewed: 07/19/2017 Elsevier Patient Education  2020 [employer].

## 2024-06-08 NOTE — Transfer of Care (Signed)
 Immediate Anesthesia Transfer of Care Note  Patient: [name]  Procedure(s) Performed: ATRIAL FIBRILLATION ABLATION  Patient Location: PACU  Anesthesia Type:General  Level of Consciousness: awake and alert   Airway & Oxygen Therapy: Patient Spontanous Breathing and Patient connected to nasal cannula oxygen  Post-op Assessment: Report given to RN and Post -op Vital signs reviewed and stable  Post vital signs: Reviewed and stable  Last Vitals:  Vitals Value Taken Time  BP    Temp    Pulse 74 06/08/24 12:26  Resp 16 06/08/24 12:26  SpO2 100 % 06/08/24 12:26  Vitals shown include unfiled device data.  Last Pain: There were no vitals filed for this visit.       Complications: There were no known notable events for this encounter.

## 2024-06-08 NOTE — Progress Notes (Signed)
 Pt and husband received discharge instructions. Teach back performed, IV removed, no complications. Rt fem site is clean dry intact, site is soft and no signs of bleeding. Pt escorted out via wheelchair to husband's vehicle.

## 2024-06-08 NOTE — H&P (Signed)
 Electrophysiology Note:   Date:  06/08/24  ID:  [name], DOB 06-01-1956, MRN [id number]   Primary Cardiologist: Annabella Scarce, MD Electrophysiologist: Fonda Kitty, MD       History of Present Illness:   [name] is a 68 y.o. female with h/o normocytic anemia, persistent atrial flutter/fibrillation, chronic systolic heart failure, syncope who is being seen today for evaluation of his atrial fibrillation.   Admission 5/11 - 12/02/2023 after episode of near syncope while at church treated with IV fluid found of paroxysmal atrial flutter and underwent cardioversion 12/01/2023. She has been experiencing atrial flutter without noticeable symptoms such as fatigue, shortness of breath, or increased tiredness since the onset. Her heart rhythm issues began with atrial fibrillation noted on an EKG from Dec 07, 2023, transitioning to atrial flutter by Dec 17, 2023.  Started on amiodarone  and underwent cardioversion on June 10.   Discussed the use of AI scribe software for clinical note transcription with the patient, who gave verbal consent to proceed.   History of Present Illness [name] is a 68 year old female with atrial fibrillation who presents with recurrence of arrhythmia after cardioversion.   She underwent a cardioversion procedure on December 28, 2023, which initially restored her heart to normal rhythm. She felt better immediately after the procedure, noting 'clear eyes' and 'a little more energy'. However, she has experienced a recurrence of atrial fibrillation despite taking amiodarone  as prescribed.   She mentions a stressful event, the death of her sister-in-law, which occurred on a Sunday, possibly contributing to her current condition. Additionally, she describes a physically demanding day involving a long walk and exposure to heat, which may have influenced her heart rhythm.   Her current medications include amiodarone and Eliquis, with no changes in her  regimen. She has not experienced any bleeding issues associated with Eliquis.   She is concerned about her kidney function after seeing a note about acute kidney injury in her medical records. She has a family history of diabetes, with her sister having diabetes-related complications, but she herself is only pre-diabetic.   She reports that she is otherwise doing relatively well.  She denies orthopnea, worsening shortness of breath, lower extremity edema, chest pain, syncope.  Interval: Patient presents today for planned catheter ablation. Reports feeling relatively well. Has some pain in left groin which has been chronic. No new or acute complaints.   Review of systems complete and found to be negative unless listed in HPI.    EP Information / Studies Reviewed:      EKG Interpretation Date/Time:                  Tuesday January 11 2024 15:00:59 EDT Ventricular Rate:         86 PR Interval:                   QRS Duration:             86 QT Interval:                 482 QTC Calculation:576 R Axis:                         28    Text Interpretation:      Atrial fibrillation with premature ventricular or aberrantly conducted complexes Artifact When compared with ECG of 28-Dec-2023 10:25, Atrial fibrillation has replaced Sinus rhythm Vent. rate has increased BY  34  BPM Nonspecific T wave abnormality has replaced inverted T waves in Inferior leads Confirmed by Kennyth Chew [phone]) on 01/12/2024 9:57:20 PM    EKG 12/17/23: AFL    EKG 12/07/23: AF    TEE 12/01/23:  1. Left ventricular ejection fraction, by estimation, is 40 to 45%. The  left ventricle has mildly decreased function. The left ventricle  demonstrates global hypokinesis.   2. Right ventricular systolic function is mildly reduced. The right  ventricular size is normal.   3. Left atrial size was moderately dilated. No left atrial/left atrial  appendage thrombus was detected. The LAA emptying velocity was 59 cm/s.   4. The mitral  valve is normal in structure. Mild mitral valve  regurgitation. No evidence of mitral stenosis.   5. The aortic valve is tricuspid. Aortic valve regurgitation is trivial.  No aortic stenosis is present.   6. 3D performed of the LAA and demonstrates 3D of LAA without thrombus.    Echo 11/29/23:   1. Left ventricular ejection fraction, by estimation, is 40 to 45%. The  left ventricle has mildly decreased function. The left ventricle  demonstrates global hypokinesis. Left ventricular diastolic parameters are  indeterminate.   2. Right ventricular systolic function is mildly reduced. The right  ventricular size is normal. Tricuspid regurgitation signal is inadequate  for assessing PA pressure.   3. Left atrial size was mildly dilated.   4. The mitral valve is normal in structure. Trivial mitral valve  regurgitation. No evidence of mitral stenosis. Moderate mitral annular  calcification.   5. The aortic valve is tricuspid. There is mild calcification of the  aortic valve. Aortic valve regurgitation is not visualized. No aortic  stenosis is present.   6. The inferior vena cava is dilated in size with <50% respiratory  variability, suggesting right atrial pressure of 15 mmHg.   7. The patient was in atrial flutter.   8. Technically difficult study with poor acoustic windows.    Risk Assessment/Calculations:     CHA2DS2-VASc Score = 4   This indicates a 4.8% annual risk of stroke. The patient's score is based upon: CHF History: 1 HTN History: 0 Diabetes History: 0 Stroke History: 0 Vascular Disease History: 1 (aortic atherosclerosis) Age Score: 1 Gender Score: 1           Physical Exam:    Today's Vitals   06/08/24 0839  BP: 120/68  Pulse: 62  Resp: 18  SpO2: 100%  Weight: 95.3 kg  Height: 5' 4 (1.626 m)   Body mass index is 36.05 kg/m.   GEN: Well nourished, well developed in no acute distress NECK: No JVD CARDIAC: Normal rate, regular rhythm RESPIRATORY:  Clear to  auscultation without rales, wheezing or rhonchi  ABDOMEN: Soft, non-distended EXTREMITIES:  Trace edema; No deformity    ASSESSMENT AND PLAN:     #. Persistent atrial fibrillation, symptomatic: Unknown duration. Diagnosed May 2025, but patient suspects may have been present for much longer.   Associated with drop in LVEF. For this reason, we have prioritized a rhythm control strategy. Underwent cardioversion, now with recurrence within 2 weeks.  Currently rate controlled. #. Typical atrial flutter, symptomatic: #. High risk medication use: Amiodarone .  LFTs normal 12/26/2023.  TSH normal 12/17/2023. -Discussed treatment options today for AF including antiarrhythmic drug therapy and ablation. Discussed risks, recovery and likelihood of success with each treatment strategy. Risk, benefits, and alternatives to EP study and ablation for afib were discussed. These risks include but are not limited  to stroke, bleeding, vascular damage, tamponade, perforation, damage to the esophagus, lungs, phrenic nerve and other structures, pulmonary vein stenosis, worsening renal function, coronary vasospasm and death.  Discussed potential need for repeat ablation procedures and antiarrhythmic drugs after an initial ablation. The patient understands these risk and wishes to proceed. - Continue metoprolol  XL 25mg  daily.  - Continue amiodarone  200 mg once daily.   #. 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    #. 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 -Rhythm control as above.  Repeat echocardiogram in 3 months. -Continue GDMT - metoprolol  XL 25mg  daily, empagliflozin  10mg  daily - and follow up with primary cardiologist.     Follow up with EPP APP 3 months after ablation.   Signed, Fonda Kitty, MD

## 2024-06-08 NOTE — Anesthesia Procedure Notes (Signed)
 Procedure Name: Intubation Date/Time: 06/08/2024 9:33 AM  Performed by: Lyle Delon LABOR, CRNAPre-anesthesia Checklist: Patient identified, Emergency Drugs available, Suction available and Patient being monitored Patient Re-evaluated:Patient Re-evaluated prior to induction Oxygen Delivery Method: Circle system utilized Preoxygenation: Pre-oxygenation with 100% oxygen Induction Type: IV induction Ventilation: Mask ventilation without difficulty Laryngoscope Size: Mac and 4 Grade View: Grade I Tube type: Oral Tube size: 7.0 mm Number of attempts: 1 Airway Equipment and Method: Stylet and Oral airway Placement Confirmation: ETT inserted through vocal cords under direct vision, positive ETCO2 and breath sounds checked- equal and bilateral Secured at: 21 cm Tube secured with: Tape Dental Injury: Teeth and Oropharynx as per pre-operative assessment

## 2024-06-09 ENCOUNTER — Telehealth (HOSPITAL_COMMUNITY): Payer: Self-pay

## 2024-06-09 NOTE — Anesthesia Postprocedure Evaluation (Signed)
 Anesthesia Post Note  Patient: [name]  Procedure(s) Performed: ATRIAL FIBRILLATION ABLATION     Patient location during evaluation: PACU Anesthesia Type: General Level of consciousness: awake and alert Pain management: pain level controlled Vital Signs Assessment: post-procedure vital signs reviewed and stable Respiratory status: spontaneous breathing, nonlabored ventilation, respiratory function stable and patient connected to nasal cannula oxygen Cardiovascular status: blood pressure returned to baseline and stable Postop Assessment: no apparent nausea or vomiting Anesthetic complications: no   There were no known notable events for this encounter.  Last Vitals:  Vitals:   06/08/24 1530 06/08/24 1600  BP:  (!) 99/50  Pulse: 62 61  Resp: 14 16  Temp:    SpO2: 97% 99%    Last Pain:  Vitals:   06/08/24 1400  TempSrc:   PainSc: 0-No pain                 Epifanio Lamar BRAVO

## 2024-06-09 NOTE — Telephone Encounter (Signed)
 Spoke with patient to complete post procedure follow up call.  Patient reports no complications with groin sites.   Instructions reviewed with patient:  Remove large bandage at puncture site after 24 hours. It is normal to have bruising, tenderness, mild swelling, and a pea or marble sized lump/knot at the groin site which can take up to three months to resolve.  Get help right away if you notice sudden swelling at the puncture site.  Check your puncture site every day for signs of infection: fever, redness, swelling, pus drainage, warmth, foul odor or excessive pain. If this occurs, please call [phone], to speak with the RN Navigator. Get help right away if your puncture site is bleeding and the bleeding does not stop after applying firm pressure to the area.  You may continue to have skipped beats/ atrial fibrillation during the first several months after your procedure.  It is very important not to miss any doses of your blood thinner Eliquis .    You will follow up with the Afib clinic 4 weeks after your procedure and follow up with the APP 3 months after your procedure.  Activity restrictions reviewed.  Patient verbalized understanding to all instructions provided.

## 2024-06-10 ENCOUNTER — Encounter (HOSPITAL_COMMUNITY): Payer: Self-pay | Admitting: Cardiology

## 2024-06-12 MED FILL — Atropine Sulfate Soln Prefill Syr 1 MG/10ML (0.1 MG/ML): INTRAMUSCULAR | Qty: 10 | Status: AC

## 2024-06-12 MED FILL — Fentanyl Citrate Preservative Free (PF) Inj 100 MCG/2ML: INTRAMUSCULAR | Qty: 2 | Status: AC

## 2024-06-14 ENCOUNTER — Other Ambulatory Visit (HOSPITAL_BASED_OUTPATIENT_CLINIC_OR_DEPARTMENT_OTHER): Payer: Self-pay

## 2024-06-14 ENCOUNTER — Other Ambulatory Visit: Payer: Self-pay

## 2024-06-19 ENCOUNTER — Other Ambulatory Visit (HOSPITAL_BASED_OUTPATIENT_CLINIC_OR_DEPARTMENT_OTHER): Payer: Self-pay

## 2024-06-21 ENCOUNTER — Other Ambulatory Visit (HOSPITAL_BASED_OUTPATIENT_CLINIC_OR_DEPARTMENT_OTHER): Payer: Self-pay

## 2024-06-28 ENCOUNTER — Other Ambulatory Visit (HOSPITAL_BASED_OUTPATIENT_CLINIC_OR_DEPARTMENT_OTHER): Payer: Self-pay | Admitting: Family

## 2024-06-28 DIAGNOSIS — I5022 Chronic systolic (congestive) heart failure: Secondary | ICD-10-CM

## 2024-06-28 DIAGNOSIS — I4892 Unspecified atrial flutter: Secondary | ICD-10-CM

## 2024-06-30 ENCOUNTER — Encounter (HOSPITAL_BASED_OUTPATIENT_CLINIC_OR_DEPARTMENT_OTHER): Payer: Self-pay | Admitting: Family

## 2024-06-30 ENCOUNTER — Other Ambulatory Visit (HOSPITAL_BASED_OUTPATIENT_CLINIC_OR_DEPARTMENT_OTHER): Payer: Self-pay

## 2024-06-30 ENCOUNTER — Ambulatory Visit (INDEPENDENT_AMBULATORY_CARE_PROVIDER_SITE_OTHER): Admitting: Family

## 2024-06-30 VITALS — BP 104/70 | HR 58 | Ht 64.0 in | Wt 220.6 lb

## 2024-06-30 DIAGNOSIS — R400 Somnolence: Secondary | ICD-10-CM | POA: Diagnosis not present

## 2024-06-30 DIAGNOSIS — I4819 Other persistent atrial fibrillation: Secondary | ICD-10-CM | POA: Diagnosis not present

## 2024-06-30 DIAGNOSIS — E559 Vitamin D deficiency, unspecified: Secondary | ICD-10-CM

## 2024-06-30 DIAGNOSIS — R5383 Other fatigue: Secondary | ICD-10-CM

## 2024-06-30 DIAGNOSIS — I5020 Unspecified systolic (congestive) heart failure: Secondary | ICD-10-CM

## 2024-06-30 DIAGNOSIS — D6859 Other primary thrombophilia: Secondary | ICD-10-CM

## 2024-06-30 MED ORDER — FLUZONE HIGH-DOSE 0.5 ML IM SUSY
0.5000 mL | PREFILLED_SYRINGE | Freq: Once | INTRAMUSCULAR | 0 refills | Status: AC
  Filled 2024-06-30: qty 0.5, 1d supply, fill #0

## 2024-06-30 MED ORDER — COMIRNATY 30 MCG/0.3ML IM SUSY
0.3000 mL | PREFILLED_SYRINGE | Freq: Once | INTRAMUSCULAR | 0 refills | Status: AC
  Filled 2024-06-30: qty 0.3, 1d supply, fill #0

## 2024-06-30 NOTE — Patient Instructions (Addendum)
 Medication Instructions:  Continue your current medications  *If you need a refill on your cardiac medications before your next appointment, please call your pharmacy*   Lab Work: Your physician recommends that you return for lab work today: thyroid  panel, CBC, BMP  If you have labs (blood work) drawn today and your tests are completely normal, you will receive your results only by: MyChart Message (if you have MyChart) OR A paper copy in the mail If you have any lab test that is abnormal or we need to change your treatment, we will call you to review the results.   Testing/Procedures: Your physician has requested that you have an echocardiogram. Echocardiography is a painless test that uses sound waves to create images of your heart. It provides your doctor with information about the size and shape of your heart and how well your hearts chambers and valves are working. This procedure takes approximately one hour. There are no restrictions for this procedure. Please do NOT wear cologne, perfume, aftershave, or lotions (deodorant is allowed). Please arrive 15 minutes prior to your appointment time.  Please note: We ask at that you not bring children with you during ultrasound (echo/ vascular) testing. Due to room size and safety concerns, children are not allowed in the ultrasound rooms during exams. Our front office staff cannot provide observation of children in our lobby area while testing is being conducted. An adult accompanying a patient to their appointment will only be allowed in the ultrasound room at the discretion of the ultrasound technician under special circumstances. We apologize for any inconvenience.     WatchPAT?  Is a FDA cleared portable home sleep study test that uses a watch and 3 points of contact to monitor 7 different channels, including your heart rate, oxygen saturations, body position, snoring, and chest motion.  The study is easy to use from the comfort of your  own home and accurately detect sleep apnea.  Before bed, you attach the chest sensor, attached the sleep apnea bracelet to your nondominant hand, and attach the finger probe.  After the study, the raw data is downloaded from the watch and scored for apnea events.   For more information: [email or web address].   We have ordered this study and once approved by insurance it will be mailed to your home.      Follow-Up: At [facility], you and your health needs are our priority.  As part of our continuing mission to provide you with exceptional heart care, our providers are all part of one team.  This team includes your primary Cardiologist (physician) and Advanced Practice Providers or APPs (Physician Assistants and Nurse Practitioners) who all work together to provide you with the care you need, when you need it.  Your next appointment:    As scheduled with AFib Clinic  As scheduled with Daphne Barrack, PA AND 3 month(s) with Annabella Scarce, MD, Rosaline Bane, NP, or Reche Finder, NP    We recommend signing up for the patient portal called MyChart.  Sign up information is provided on this After Visit Summary.  MyChart is used to connect with patients for Virtual Visits (Telemedicine).  Patients are able to view lab/test results, encounter notes, upcoming appointments, etc.  Non-urgent messages can be sent to your provider as well.   To learn more about what you can do with MyChart, go to [email or web address].   Other Instructions  Ask AFib clinic at your upcoming visit about returning to exercise.

## 2024-06-30 NOTE — Progress Notes (Signed)
 Cardiology Office Note   Date:  06/30/2024  ID:  Nuri, Branca 05-Apr-1956, MRN [id number] PCP: Dyane Anthony RAMAN, FNP  [facility] Providers Cardiologist:  Annabella Scarce, MD Electrophysiologist:  Fonda Kitty, MD     History of Present Illness Millie Forde is a 68 y.o. female with history of normocytic anemia, persistent atrial flutter/fibrillation, HFrEF, syncope.  Admission 5/11 - 12/02/2023 after episode of near syncope while at church treated with IV fluid found of paroxysmal atrial flutter and underwent cardioversion 12/01/2023.  Echo reduced LVEF 40 to 45%.  GDMT titration limited by hypotension.  There is also pneumonia on imaging and treated with IV Unasyn  and doxycycline .  Acute DVT of tibial vein of LLE, discharged on Eliquis .  At visit 12/07/2023 with A-fib clinic she was found to be in recurrent atrial fibrillation. Amiodarone  as bridge to ablation initiated. Sleep study ordered, never performed. At visit 03/02/24 Furosemide  increased to 40mg  daily for LE edema. Underwent ablation with Dr. Kitty 06/08/24.   Presents today for follow up independently. Report trouble falling and staying asleep. She is bothered by fatigue ongoing since May. Does note some concern about what might happen when she sleeps. Reports no sensation of her heart racing and no dizziness. She was expecting significant increase in energy level after ablation. Asks if she would hear a buzz after ablation. Discussed nothing would have been done during her procedure to cause this buzz. She is anxious, tearful when endorsing frustration with weight gain.  We again reviewed that Medicare only covers GLP-1 for diabetics or individuals having had heart attack or stroke which she has thankfully not had.  She continues to report bilateral lower extremity edema, no significant edema appreciated on exam, and reports her family often holds onto fluid.   ROS: Please see the history of present  illness.    All other systems reviewed and are negative.   Studies Reviewed      Cardiac Studies & Procedures   ______________________________________________________________________________________________     ECHOCARDIOGRAM  ECHOCARDIOGRAM COMPLETE 11/29/2023  Narrative ECHOCARDIOGRAM REPORT    Patient Name:   ESTEPHANIA LICCIARDI Date of Exam: 11/29/2023 Medical Rec #:  [id number]           Height:       64.0 in Accession #:    [id number]          Weight:       200.6 lb Date of Birth:  [birth year]          BSA:          1.959 m Patient Age:    67 years            BP:           121/86 mmHg Patient Gender: F                   HR:           97 bpm. Exam Location:  Inpatient  Procedure: 2D Echo, Color Doppler and Cardiac Doppler (Both Spectral and Color Flow Doppler were utilized during procedure).  Indications:    Atrial flutter  History:        Patient has no prior history of Echocardiogram examinations. DVT.  Sonographer:    Juanita Shaw Referring Phys: [id number] VISHAL R PATEL  IMPRESSIONS   1. Left ventricular ejection fraction, by estimation, is 40 to 45%. The left ventricle has mildly decreased function. The left ventricle demonstrates global hypokinesis. Left ventricular diastolic  parameters are indeterminate. 2. Right ventricular systolic function is mildly reduced. The right ventricular size is normal. Tricuspid regurgitation signal is inadequate for assessing PA pressure. 3. Left atrial size was mildly dilated. 4. The mitral valve is normal in structure. Trivial mitral valve regurgitation. No evidence of mitral stenosis. Moderate mitral annular calcification. 5. The aortic valve is tricuspid. There is mild calcification of the aortic valve. Aortic valve regurgitation is not visualized. No aortic stenosis is present. 6. The inferior vena cava is dilated in size with <50% respiratory variability, suggesting right atrial pressure of 15 mmHg. 7. The patient was in  atrial flutter. 8. Technically difficult study with poor acoustic windows.  FINDINGS Left Ventricle: Left ventricular ejection fraction, by estimation, is 40 to 45%. The left ventricle has mildly decreased function. The left ventricle demonstrates global hypokinesis. The left ventricular internal cavity size was normal in size. There is no left ventricular hypertrophy. Left ventricular diastolic parameters are indeterminate.  Right Ventricle: The right ventricular size is normal. No increase in right ventricular wall thickness. Right ventricular systolic function is mildly reduced. Tricuspid regurgitation signal is inadequate for assessing PA pressure.  Left Atrium: Left atrial size was mildly dilated.  Right Atrium: Right atrial size was normal in size.  Pericardium: There is no evidence of pericardial effusion.  Mitral Valve: The mitral valve is normal in structure. Moderate mitral annular calcification. Trivial mitral valve regurgitation. No evidence of mitral valve stenosis. MV peak gradient, 5.9 mmHg. The mean mitral valve gradient is 2.0 mmHg.  Tricuspid Valve: The tricuspid valve is normal in structure. Tricuspid valve regurgitation is trivial.  Aortic Valve: The aortic valve is tricuspid. There is mild calcification of the aortic valve. Aortic valve regurgitation is not visualized. No aortic stenosis is present. Aortic valve mean gradient measures 3.0 mmHg. Aortic valve peak gradient measures 4.9 mmHg. Aortic valve area, by VTI measures 2.96 cm.  Pulmonic Valve: The pulmonic valve was normal in structure. Pulmonic valve regurgitation is trivial.  Aorta: The aortic root is normal in size and structure.  Venous: The inferior vena cava is dilated in size with less than 50% respiratory variability, suggesting right atrial pressure of 15 mmHg.  IAS/Shunts: No atrial level shunt detected by color flow Doppler.   LEFT VENTRICLE PLAX 2D LVIDd:         4.70 cm      Diastology LVIDs:          3.30 cm      LV e' medial:    12.70 cm/s LV PW:         0.70 cm      LV E/e' medial:  8.5 LV IVS:        0.80 cm      LV e' lateral:   12.00 cm/s LVOT diam:     2.00 cm      LV E/e' lateral: 9.0 LV SV:         50 LV SV Index:   25 LVOT Area:     3.14 cm  LV Volumes (MOD) LV vol d, MOD A2C: 116.0 ml LV vol d, MOD A4C: 137.0 ml LV vol s, MOD A2C: 41.3 ml LV vol s, MOD A4C: 59.0 ml LV SV MOD A2C:     74.7 ml LV SV MOD A4C:     137.0 ml LV SV MOD BP:      77.8 ml  RIGHT VENTRICLE             IVC RV  Basal diam:  3.00 cm     IVC diam: 2.10 cm RV Mid diam:    1.90 cm RV S prime:     10.10 cm/s TAPSE (M-mode): 2.9 cm  LEFT ATRIUM           Index        RIGHT ATRIUM           Index LA diam:      4.50 cm 2.30 cm/m   RA Area:     11.90 cm LA Vol (A2C): 41.5 ml 21.18 ml/m  RA Volume:   24.00 ml  12.25 ml/m LA Vol (A4C): 61.4 ml 31.34 ml/m AORTIC VALVE                    PULMONIC VALVE AV Area (Vmax):    2.43 cm     PV Vmax:       0.65 m/s AV Area (Vmean):   2.42 cm     PV Peak grad:  1.7 mmHg AV Area (VTI):     2.96 cm AV Vmax:           110.53 cm/s AV Vmean:          78.433 cm/s AV VTI:            0.168 m AV Peak Grad:      4.9 mmHg AV Mean Grad:      3.0 mmHg LVOT Vmax:         85.40 cm/s LVOT Vmean:        60.500 cm/s LVOT VTI:          0.158 m LVOT/AV VTI ratio: 0.94  AORTA Ao Root diam: 3.30 cm Ao Asc diam:  3.00 cm  MITRAL VALVE MV Area (PHT): 4.01 cm     SHUNTS MV Area VTI:   1.80 cm     Systemic VTI:  0.16 m MV Peak grad:  5.9 mmHg     Systemic Diam: 2.00 cm MV Mean grad:  2.0 mmHg MV Vmax:       1.21 m/s MV Vmean:      69.7 cm/s MV Decel Time: 189 msec MV E velocity: 108.00 cm/s  Dalton McleanMD Electronically signed by Ezra Kanner Signature Date/Time: 11/29/2023/7:39:26 PM    Final   TEE  ECHO TEE 12/01/2023  Narrative TRANSESOPHOGEAL ECHO REPORT    Patient Name:   JODA BRAATZ Date of Exam: 12/01/2023 Medical Rec #:   [id number]           Height:       64.0 in Accession #:    [id number]          Weight:       199.9 lb Date of Birth:  01-24-1956          BSA:          1.956 m Patient Age:    67 years            BP:           133/85 mmHg Patient Gender: F                   HR:           136 bpm. Exam Location:  Inpatient  Procedure: 3D Echo, Transesophageal Echo, Cardiac Doppler and Color Doppler (Both Spectral and Color Flow Doppler were utilized during procedure).  Indications:     I48.92* Unspecified atrial flutter  History:  Patient has prior history of Echocardiogram examinations, most recent 11/29/2023. CHF, Abnormal ECG, Arrythmias:Atrial Flutter, Signs/Symptoms:Syncope; Risk Factors:Diabetes.  Sonographer:     Ellouise Mose RDCS Referring Phys:  [id number] WADDELL A PARCELLS Diagnosing Phys: Shelda Bruckner MD  PROCEDURE: After discussion of the risks and benefits of a TEE, an informed consent was obtained from the patient. The transesophogeal probe was passed without difficulty through the esophogus of the patient. Imaged were obtained with the patient in a left lateral decubitus position. Sedation performed by different physician. The patient was monitored while under deep sedation. Anesthestetic sedation was provided intravenously by Anesthesiology: 382mg  of Propofol . Image quality was excellent. The patient's vital signs; including heart rate, blood pressure, and oxygen saturation; remained stable throughout the procedure. The patient developed no complications during the procedure. A successful direct current cardioversion was performed at 200 joules with 1 attempt.  IMPRESSIONS   1. Left ventricular ejection fraction, by estimation, is 40 to 45%. The left ventricle has mildly decreased function. The left ventricle demonstrates global hypokinesis. 2. Right ventricular systolic function is mildly reduced. The right ventricular size is normal. 3. Left atrial size was moderately dilated.  No left atrial/left atrial appendage thrombus was detected. The LAA emptying velocity was 59 cm/s. 4. The mitral valve is normal in structure. Mild mitral valve regurgitation. No evidence of mitral stenosis. 5. The aortic valve is tricuspid. Aortic valve regurgitation is trivial. No aortic stenosis is present. 6. 3D performed of the LAA and demonstrates 3D of LAA without thrombus.  Conclusion(s)/Recommendation(s): No LA/LAA thrombus identified. Successful cardioversion performed with restoration of normal sinus rhythm.  FINDINGS Left Ventricle: Left ventricular ejection fraction, by estimation, is 40 to 45%. The left ventricle has mildly decreased function. The left ventricle demonstrates global hypokinesis. The left ventricular internal cavity size was normal in size.  Right Ventricle: The right ventricular size is normal. No increase in right ventricular wall thickness. Right ventricular systolic function is mildly reduced.  Left Atrium: Left atrial size was moderately dilated. No left atrial/left atrial appendage thrombus was detected. The LAA emptying velocity was 59 cm/s.  Right Atrium: Right atrial size was normal in size.  Pericardium: Trivial pericardial effusion is present. The pericardial effusion is anterior to the right ventricle.  Mitral Valve: The mitral valve is normal in structure. Mild mitral valve regurgitation. No evidence of mitral valve stenosis.  Tricuspid Valve: The tricuspid valve is normal in structure. Tricuspid valve regurgitation is mild . No evidence of tricuspid stenosis.  Aortic Valve: The aortic valve is tricuspid. Aortic valve regurgitation is trivial. No aortic stenosis is present.  Pulmonic Valve: The pulmonic valve was grossly normal. Pulmonic valve regurgitation is mild. No evidence of pulmonic stenosis.  Aorta: The aortic root and ascending aorta are structurally normal, with no evidence of dilitation.  IAS/Shunts: No atrial level shunt detected by  color flow Doppler.  Additional Comments: Spectral Doppler performed.  Shelda Bruckner MD Electronically signed by Shelda Bruckner MD Signature Date/Time: 12/01/2023/9:03:58 PM    Final        ______________________________________________________________________________________________         Risk Assessment/Calculations  CHA2DS2-VASc Score = 4   This indicates a 4.8% annual risk of stroke. The patient's score is based upon: CHF History: 1 HTN History: 0 Diabetes History: 0 Stroke History: 0 Vascular Disease History: 1 (aortic atherosclerosis) Age Score: 1 Gender Score: 1            Physical Exam VS:  BP 104/70   Pulse (!) 58  Ht 5' 4 (1.626 m)   Wt 220 lb 9.6 oz (100.1 kg)   SpO2 98%   BMI 37.87 kg/m    Wt Readings from Last 3 Encounters:  06/30/24 220 lb 9.6 oz (100.1 kg)  06/08/24 210 lb (95.3 kg)  03/02/24 205 lb 9.6 oz (93.3 kg)    GEN: Well nourished, well developed in no acute distress NECK: No JVD; No carotid bruits CARDIAC: IRIR, no murmurs, rubs, gallops RESPIRATORY:  Clear to auscultation without rales, wheezing or rhonchi  ABDOMEN: Soft, non-tender, non-distended EXTREMITIES:  No edema; No deformity   ASSESSMENT AND PLAN  Systolic and diastolic heart failure - Euvolemic and well compensated on exam.  GDMT includes Lasix  40mg  daily, Toprol  25mg  daily, Jardiance  10mg  daily. Given relative hypotension, will defer ARB/ARNI or MRA.  Update echo to reassess LVEF  Daytime somnolence - STOP Bang  5. Plan for [facility] home sleep study.   Generalized fatigue / Vitamin D deficiency - labs today BMP, thyroid  panel, CBC, vitamin D. Plan for sleep study, as above. If workup unremarkable, further workup as per PCP.  Paroxysmal atrial flutter s/p ablation - upcoming follow up with [facility] post ablation. Discussed the buzz she senses is not from ablation. Continue Amiodarone  200mg  daily, Eliquis  5mg  BID, Toprol .   DVT LLE- incidental  finding during 12/2023 admission. Continue eliquis  5mg  BID.  Hypercoagulable state- CHA2DS2-VASc Score = 4 [CHF History: 1, HTN History: 0, Diabetes History: 0, Stroke History: 0, Vascular Disease History: 1 (aortic atherosclerosis), Age Score: 1, Gender Score: 1].  Therefore, the patient's annual risk of stroke is 4.8 %.    Denies bleeding complications. Continue eliquis  5mg  BID.   HLD/aortic atherosclerosis- Not presently on lipid lowering therapy. CCTA 05/18/24 with calcium score of 0.  03/13/24 ldl 125. Given aortic atherosclerosis, recommend LDL goal <70. Not addressed at this clinic visit, discuss at follow up       Dispo: follow up with general cardiology in 3 mos. follow up with AFib clinic and EP as scheduled.   Signed, Reche GORMAN Finder, NP

## 2024-07-01 LAB — BASIC METABOLIC PANEL WITH GFR
BUN/Creatinine Ratio: 16 (ref 12–28)
BUN: 17 mg/dL (ref 8–27)
CO2: 21 mmol/L (ref 20–29)
Calcium: 9.8 mg/dL (ref 8.7–10.3)
Chloride: 107 mmol/L — ABNORMAL HIGH (ref 96–106)
Creatinine, Ser: 1.04 mg/dL — ABNORMAL HIGH (ref 0.57–1.00)
Glucose: 113 mg/dL — ABNORMAL HIGH (ref 70–99)
Potassium: 3.9 mmol/L (ref 3.5–5.2)
Sodium: 149 mmol/L — ABNORMAL HIGH (ref 134–144)
eGFR: 59 mL/min/1.73 — ABNORMAL LOW (ref >59)

## 2024-07-01 LAB — CBC
Hematocrit: 44.8 % (ref 34.0–46.6)
Hemoglobin: 14.7 g/dL (ref 11.1–15.9)
MCH: 27.7 pg (ref 26.6–33.0)
MCHC: 32.8 g/dL (ref 31.5–35.7)
MCV: 84 fL (ref 79–97)
Platelets: 422 x10E3/uL (ref 150–450)
RBC: 5.31 x10E6/uL — ABNORMAL HIGH (ref 3.77–5.28)
RDW: 14.5 % (ref 11.7–15.4)
WBC: 4 x10E3/uL (ref 3.4–10.8)

## 2024-07-01 LAB — THYROID PANEL WITH TSH
Free Thyroxine Index: 3.7 (ref 1.2–4.9)
T3 Uptake Ratio: 29 % (ref 24–39)
T4, Total: 12.6 ug/dL — ABNORMAL HIGH (ref 4.5–12.0)
TSH: 0.963 u[IU]/mL (ref 0.450–4.500)

## 2024-07-01 LAB — VITAMIN D 25 HYDROXY (VIT D DEFICIENCY, FRACTURES): Vit D, 25-Hydroxy: 53.6 ng/mL (ref 30.0–100.0)

## 2024-07-03 ENCOUNTER — Ambulatory Visit (HOSPITAL_BASED_OUTPATIENT_CLINIC_OR_DEPARTMENT_OTHER): Payer: Self-pay | Admitting: Family

## 2024-07-03 ENCOUNTER — Telehealth: Payer: Self-pay | Admitting: *Deleted

## 2024-07-03 DIAGNOSIS — R7989 Other specified abnormal findings of blood chemistry: Secondary | ICD-10-CM

## 2024-07-03 DIAGNOSIS — I5020 Unspecified systolic (congestive) heart failure: Secondary | ICD-10-CM

## 2024-07-03 DIAGNOSIS — Z79899 Other long term (current) drug therapy: Secondary | ICD-10-CM

## 2024-07-03 NOTE — Telephone Encounter (Signed)
 Left message for the pt to call back for results.   Results were also sent to the pts active mychart account by Reche Finder, NP.    Will go ahead and place the repeat BMET in 2-3 weeks in the system and let pt know she can come into our lab around that time to have this done.

## 2024-07-03 NOTE — Telephone Encounter (Signed)
-----   Message from Reche Finder, NP sent at 07/03/2024 10:21 AM EST ----- CBC no anemia no infection.  Normal thyroid  hormone with mildly elevated T4, not of concern.  No indication for thyroid  medication.  Normal vitamin D.  Slight escalation in creatinine, recommend  increasing oral hydration.  No significant abnormalities that would contribute to fatigue.  Recommend repeat BMP in 2-3 weeks to ensure kidney function improved.

## 2024-07-03 NOTE — Telephone Encounter (Signed)
-----   Message from Nurse Porter HERO sent at 06/30/2024  4:24 PM EST ----- Regarding: ITAMAR PER CAITLIN WALKER, NP VERNEDA ordered today by Reche Finder, NP  Order is in and instructions given to the pt  STOP BANG complete and was 5  Thanks, [employer]

## 2024-07-04 ENCOUNTER — Other Ambulatory Visit (HOSPITAL_BASED_OUTPATIENT_CLINIC_OR_DEPARTMENT_OTHER): Payer: Self-pay

## 2024-07-06 ENCOUNTER — Ambulatory Visit (HOSPITAL_COMMUNITY): Admission: RE | Admit: 2024-07-06 | Admitting: Physician Assistant

## 2024-07-06 ENCOUNTER — Encounter (HOSPITAL_COMMUNITY): Payer: Self-pay | Admitting: Physician Assistant

## 2024-07-06 VITALS — BP 132/76 | HR 53 | Ht 64.0 in | Wt 226.0 lb

## 2024-07-06 DIAGNOSIS — D6859 Other primary thrombophilia: Secondary | ICD-10-CM | POA: Diagnosis not present

## 2024-07-06 DIAGNOSIS — I4891 Unspecified atrial fibrillation: Secondary | ICD-10-CM

## 2024-07-06 DIAGNOSIS — E66812 Obesity, class 2: Secondary | ICD-10-CM

## 2024-07-06 DIAGNOSIS — Z9229 Personal history of other drug therapy: Secondary | ICD-10-CM | POA: Diagnosis not present

## 2024-07-06 DIAGNOSIS — I4892 Unspecified atrial flutter: Secondary | ICD-10-CM

## 2024-07-06 DIAGNOSIS — Z6835 Body mass index (BMI) 35.0-35.9, adult: Secondary | ICD-10-CM

## 2024-07-06 DIAGNOSIS — I5020 Unspecified systolic (congestive) heart failure: Secondary | ICD-10-CM

## 2024-07-06 MED ORDER — AMIODARONE HCL 200 MG PO TABS: 100.0000 mg | ORAL_TABLET | Freq: Every day | ORAL | Status: AC

## 2024-07-06 NOTE — Patient Instructions (Addendum)
 Decrease Amiodarone  to 100mg  daily (1/2 of your 200mg )   [facility] [place] [facility]

## 2024-07-06 NOTE — Progress Notes (Signed)
 Primary Care Physician: Dyane Anthony RAMAN, FNP Primary Cardiologist: Annabella Scarce, MD Electrophysiologist: Fonda Kitty, MD  Referring Physician: Annabella Scarce, MD   [name] is a 68 y.o. female with a history of HFrEF, paroxysmal AF/flutter, DVT (on Eliquis ), aortic atherosclerosis who presents for follow up in the [facility].  The patient was initially diagnosed with atrial flutter on 11/2023 and was started on Eliquis  and metoprolol  for rate control.  She underwent a TEE guided cardioversion on 12/01/2023 with echo showing EF of 40-45%.  She was seen in follow-up by Daril Kicks, PA on 12/07/2023 and was found to be in atrial fibrillation at that time.  Shared decision was made to pursue rhythm control with ablation and patient was referred to EP and started on amiodarone  as a bridge to ablation.  She was seen by Dr. Kitty on 12/21/2023 and patient was set up for DCCV and seen back in follow-up following her cardioversion on 01/11/2024 and had recurrence 2 weeks following conversion.  Decision was made to pursue ablation and patient underwent ablation procedure on 06/08/2024 with Dr. Kitty that was successful.  She was seen in follow-up by Reche Finder, NP on 06/30/2024 and reported no episodes of palpitations or heart racing.   Patient presents today for follow up for atrial fibrillation with her husband.  She is maintaining sinus rhythm with occasional episodes of palpitations that are prevalent prior to bedtime.  She also endorses some fatigue since her ablation but denies any presyncope.  She is euvolemic on examination and blood pressures were stable at 132/76.  She has been compliant with her Eliquis  and denies any missed doses.  She reports that her groin sites have healed without any complications and she has resumed her normal activities since her ablation.  During today's visit we discussed long-term management of atrial fibrillation consisting of  weight loss, moderation of caffeine and alcohol, and being evaluated for sleep apnea.  She is scheduled for a home sleep study which will arrive to her home next week.  She had all questions answered today and was encouraged to reach out if she has any further questions following today's visit.   Today, she denies symptoms of palpitations, chest pain, shortness of breath, orthopnea, PND, lower extremity edema, dizziness, presyncope, syncope, snoring, daytime somnolence, bleeding, or neurologic sequela. The patient is tolerating medications without difficulties and is otherwise without complaint today.    Atrial Fibrillation Risk Factors:  Patient has history of daytime somnolence and home sleep study to be completed per general cardiology  Atrial Fibrillation Management history:  Previous antiarrhythmic drugs: none Previous cardioversions: 12/01/23 Previous ablations: 06/08/2024 Anticoagulation history: Eliquis   ROS- All systems are reviewed and negative except as per the HPI above.  History reviewed. No pertinent past medical history.  Current Outpatient Medications  Medication Sig Dispense Refill   apixaban  (ELIQUIS ) 5 MG TABS tablet Take 1 tablet (5 mg total) by mouth 2 (two) times daily. 28 tablet 0   ascorbic acid (VITAMIN C) 500 MG tablet Take 500 mg by mouth daily.     B Complex Vitamins (VITAMIN B COMPLEX 100 IJ) Take 1 tablet by mouth daily.     Calcium Carb-Cholecalciferol (CALCIUM-VITAMIN D PO) Take 1 tablet by mouth daily.     cholecalciferol (VITAMIN D3) 25 MCG (1000 UNIT) tablet Take 1,000 Units by mouth daily.     empagliflozin  (JARDIANCE ) 10 MG TABS tablet Take 1 tablet (10 mg total) by mouth daily before breakfast. 90  tablet 3   furosemide  (LASIX ) 40 MG tablet Take 1 tablet (40 mg total) by mouth daily. 90 tablet 1   Magnesium  (MAGNESIUM  COMPLEX HIGH POTENCY) 250 MG CAPS Take 250 mg by mouth at bedtime.     metoprolol  succinate (TOPROL -XL) 25 MG 24 hr tablet Take  ONE-HALF tablets (12.5 mg total) by mouth daily. (Patient taking differently: Take 25 mg by mouth daily.) 45 tablet 2   metoprolol  succinate (TOPROL -XL) 25 MG 24 hr tablet Take 1 tablet (25 mg total) by mouth daily. 90 tablet 2   Potassium Chloride  ER 20 MEQ TBCR Take 1 tablet (20 mEq total) by mouth daily. 90 tablet 3   amiodarone  (PACERONE ) 200 MG tablet Take 0.5 tablets (100 mg total) by mouth daily.     No current facility-administered medications for this encounter.    Physical Exam: BP 132/76   Pulse (!) 53   Ht 5' 4 (1.626 m)   Wt 102.5 kg   BMI 38.79 kg/m   GEN: Well nourished, well developed in no acute distress NECK: No JVD; No carotid bruits CARDIAC: Regular rate and rhythm, no murmurs, rubs, gallops RESPIRATORY:  Clear to auscultation without rales, wheezing or rhonchi  ABDOMEN: Soft, non-tender, non-distended EXTREMITIES:  No edema; No deformity   Wt Readings from Last 3 Encounters:  07/06/24 102.5 kg  06/30/24 100.1 kg  06/08/24 95.3 kg     EKG today demonstrates:   EKG Interpretation Date/Time:  Thursday July 06 2024 10:43:37 EST Ventricular Rate:  53 PR Interval:  166 QRS Duration:  100 QT Interval:  482 QTC Calculation: 452 R Axis:   3  Text Interpretation: Sinus bradycardia Otherwise normal ECG When compared with ECG of 08-Jun-2024 12:29, Confirmed by Wyn Manus [phone]) on 07/06/2024 11:37:47 AM        Patient has echo scheduled for January 2026  CHA2DS2-VASc Score = 4  The patient's score is based upon: CHF History: 1 HTN History: 0 Diabetes History: 0 Stroke History: 0 Vascular Disease History: 1 (aortic atherosclerosis) Age Score: 1 Gender Score: 1       ASSESSMENT AND PLAN: Paroxysmal Atrial Fibrillation (ICD10:  I48.0) The patient's CHA2DS2-VASc score is 4, indicating a 4.8% annual risk of stroke.   -s/p atrial flutter and fibrillation ablation by Dr. Kennyth on 06/08/2024 and patient reports no recurrence of arrhythmia with  stable groin sites and no complications noted -We discussed the importance of maintaining compliance with Eliquis  and possibility of recurrence of atrial fibrillation during the blanking period She also endorses some fatigue and occasional palpitations at night. - We will reduce amiodarone  to 100 mg daily and patient advised to contact our office if she continues to experience fatigue with plan to reduce Toprol -XL to 12.5 mg daily  Continue Eliquis  5 mg twice daily - Continue Toprol -XL 25 mg daily  Secondary Hypercoagulable State (ICD10:  D68.69) The patient is at significant risk for stroke/thromboembolism based upon her CHA2DS2-VASc Score of 4.  Continue Apixaban  (Eliquis ).   HFrEF: - Patient is scheduled for updated 2D echo and is euvolemic on exam today - Continue current treatment plan per general cardiology  DVT LLE - incidental finding during 12/2023 admission. Continue eliquis  5mg  BID.  Obesity: - Patient's BMI is 38.79 kg/m -Ambulatory referral to healthy weight and wellness  High Risk Medication Monitoring (ICD 10: S20.100) Patient requires ongoing monitoring for anti-arrhythmic medication which has the potential to cause life threatening arrhythmias. - EKG intervals stable for continuing amiodarone  with TSH and CMET obtained  recently and stable.  Follow up with Daphne Barrack as scheduled.   Jackee Alberts, NP-C [facility] [address] [place], [place] [address] [phone]

## 2024-07-10 ENCOUNTER — Encounter (INDEPENDENT_AMBULATORY_CARE_PROVIDER_SITE_OTHER): Payer: Self-pay

## 2024-07-17 ENCOUNTER — Encounter (HOSPITAL_BASED_OUTPATIENT_CLINIC_OR_DEPARTMENT_OTHER): Payer: Self-pay

## 2024-07-22 LAB — BASIC METABOLIC PANEL WITH GFR
BUN/Creatinine Ratio: 15 (ref 12–28)
BUN: 13 mg/dL (ref 8–27)
CO2: 24 mmol/L (ref 20–29)
Calcium: 9.6 mg/dL (ref 8.7–10.3)
Chloride: 102 mmol/L (ref 96–106)
Creatinine, Ser: 0.86 mg/dL (ref 0.57–1.00)
Glucose: 109 mg/dL — ABNORMAL HIGH (ref 70–99)
Potassium: 3.9 mmol/L (ref 3.5–5.2)
Sodium: 140 mmol/L (ref 134–144)
eGFR: 74 mL/min/1.73

## 2024-07-26 ENCOUNTER — Encounter (HOSPITAL_BASED_OUTPATIENT_CLINIC_OR_DEPARTMENT_OTHER): Payer: Self-pay | Admitting: Cardiology

## 2024-07-26 DIAGNOSIS — G4733 Obstructive sleep apnea (adult) (pediatric): Secondary | ICD-10-CM | POA: Diagnosis not present

## 2024-07-28 NOTE — Procedures (Signed)
" ° °  SLEEP STUDY REPORT Patient Information Study Date: 07/26/2024 Patient Name: [name] Patient ID: [id number] Birth Date: December 21, 1955 Age: 69 Gender: Female BMI: 37.6 (W=220 lb, H=5' 4'') Referring Physician: Reche Finder, NP  TEST DESCRIPTION: Home sleep apnea testing was completed using the WatchPat, a Type 1 device, utilizing  peripheral arterial tonometry (PAT), chest movement, actigraphy, pulse oximetry, pulse rate, body position and snore.  AHI was calculated with apnea and hypopnea using valid sleep time as the denominator. RDI includes apneas,  hypopneas, and RERAs. The data acquired and the scoring of sleep and all associated events were performed in  accordance with the recommended standards and specifications as outlined in the AASM Manual for the Scoring of  Sleep and Associated Events 2.2.0 (2015).  FINDINGS:  1. Moderate Obstructive Sleep Apnea with AHI17.4 /hr overall but severe during REM sleep with REM AHI 31.8.hr.   2. No Central Sleep Apnea with pAHIc 0.7/hr.  3. Oxygen desaturations as low as 83%.  4. Severe snoring was present. O2 sats were < 88% for 2.1 min.  5. Total sleep time was 7 hrs and 1 min.  6. 20.6 % of total sleep time was spent in REM sleep.   7. Normal sleep onset latency at 19 min  8. Shortened REM sleep onset latency at 47 min.   9. Total awakenings were 9.  10. Arrhythmia detection: None  DIAGNOSIS:  Moderate Obstructive Sleep Apnea (G47.33) overall and Severe Obstructive Sleep Apnea during REM sleep  RECOMMENDATIONS: 1. Clinical correlation of these findings is necessary. The decision to treat obstructive sleep apnea (OSA) is usually  based on the presence of apnea symptoms or the presence of associated medical conditions such as Hypertension,  Congestive Heart Failure, Atrial Fibrillation or Obesity. The most common symptoms of OSA are snoring, gasping for  breath while sleeping, daytime sleepiness and fatigue.  2. Initiating apnea  therapy is recommended given the presence of symptoms and/or associated conditions.  Recommend proceeding with one of the following:  a. Auto-CPAP therapy with a pressure range of 5-20cm H2O.  b. An oral appliance (OA) that can be obtained from certain dentists with expertise in sleep medicine. These are  primarily of use in non-obese patients with mild and moderate disease.  c. An ENT consultation which may be useful to look for specific causes of obstruction and possible treatment  options.  d. If patient is intolerant to PAP therapy, consider referral to ENT for evaluation for hypoglossal nerve stimulator.  3. Close follow-up is necessary to ensure success with CPAP or oral appliance therapy for maximum benefit . 4. A follow-up oximetry study on CPAP is recommended to assess the adequacy of therapy and determine the need  for supplemental oxygen or the potential need for Bi-level therapy. An arterial blood gas to determine the adequacy of  baseline ventilation and oxygenation should also be considered. 5. Healthy sleep recommendations include: adequate nightly sleep (normal 7-9 hrs/night), avoidance of caffeine after  noon and alcohol near bedtime, and maintaining a sleep environment that is cool, dark and quiet. 6. Weight loss for overweight patients is recommended. Even modest amounts of weight loss can significantly  improve the severity of sleep apnea. 7. Snoring recommendations include: weight loss where appropriate, side sleeping, and avoidance of alcohol before  bed. 8. Operation of motor vehicle should not be performed when sleepy.  Signature: Wilbert Bihari, MD; [facility]; Diplomat, American Board of Sleep  Medicine Electronically Signed: 07/28/2024 3:58:20 PM "

## 2024-08-08 ENCOUNTER — Ambulatory Visit (INDEPENDENT_AMBULATORY_CARE_PROVIDER_SITE_OTHER)

## 2024-08-08 DIAGNOSIS — I5020 Unspecified systolic (congestive) heart failure: Secondary | ICD-10-CM

## 2024-08-08 LAB — ECHOCARDIOGRAM COMPLETE
Area-P 1/2: 3.27 cm2
S' Lateral: 2.01 cm

## 2024-08-15 ENCOUNTER — Other Ambulatory Visit: Payer: Self-pay

## 2024-08-15 ENCOUNTER — Institutional Professional Consult (permissible substitution) (INDEPENDENT_AMBULATORY_CARE_PROVIDER_SITE_OTHER): Admitting: Family Medicine

## 2024-08-21 ENCOUNTER — Institutional Professional Consult (permissible substitution) (INDEPENDENT_AMBULATORY_CARE_PROVIDER_SITE_OTHER): Admitting: Physician Assistant

## 2024-08-24 ENCOUNTER — Encounter (INDEPENDENT_AMBULATORY_CARE_PROVIDER_SITE_OTHER): Payer: Self-pay

## 2024-08-28 ENCOUNTER — Institutional Professional Consult (permissible substitution) (INDEPENDENT_AMBULATORY_CARE_PROVIDER_SITE_OTHER): Admitting: Family Medicine

## 2024-09-08 ENCOUNTER — Ambulatory Visit: Admitting: Pulmonary Disease

## 2024-10-04 ENCOUNTER — Ambulatory Visit (HOSPITAL_BASED_OUTPATIENT_CLINIC_OR_DEPARTMENT_OTHER): Admitting: Family
